# Patient Record
Sex: Male | Born: 1985 | Hispanic: Yes | Marital: Married | Smoking: Never smoker
Health system: Southern US, Community
[De-identification: ages and names within clinical notes are randomized; demographics above are authoritative.]

## PROBLEM LIST (undated history)

## (undated) DIAGNOSIS — E785 Hyperlipidemia, unspecified: Secondary | ICD-10-CM

## (undated) DIAGNOSIS — Z87442 Personal history of urinary calculi: Secondary | ICD-10-CM

## (undated) DIAGNOSIS — I10 Essential (primary) hypertension: Secondary | ICD-10-CM

## (undated) HISTORY — DX: Hyperlipidemia, unspecified: E78.5

## (undated) HISTORY — DX: Essential (primary) hypertension: I10

## (undated) HISTORY — PX: APPENDECTOMY: SHX54

---

## 2013-10-22 ENCOUNTER — Emergency Department: Payer: Self-pay | Admitting: Emergency Medicine

## 2014-11-18 IMAGING — CR RIGHT FOOT COMPLETE - 3+ VIEW
1 series · 3 of 3 positions shown · non-contrast
Comparison: None.

CLINICAL DATA: Pain at the right fourth and fifth metatarsals,
status post right foot injury.

EXAM:
RIGHT FOOT COMPLETE - 3+ VIEW

[Series 1: x foot ap right · 0.14mm/px · 3 of 3 slices shown]
[im 1/3]
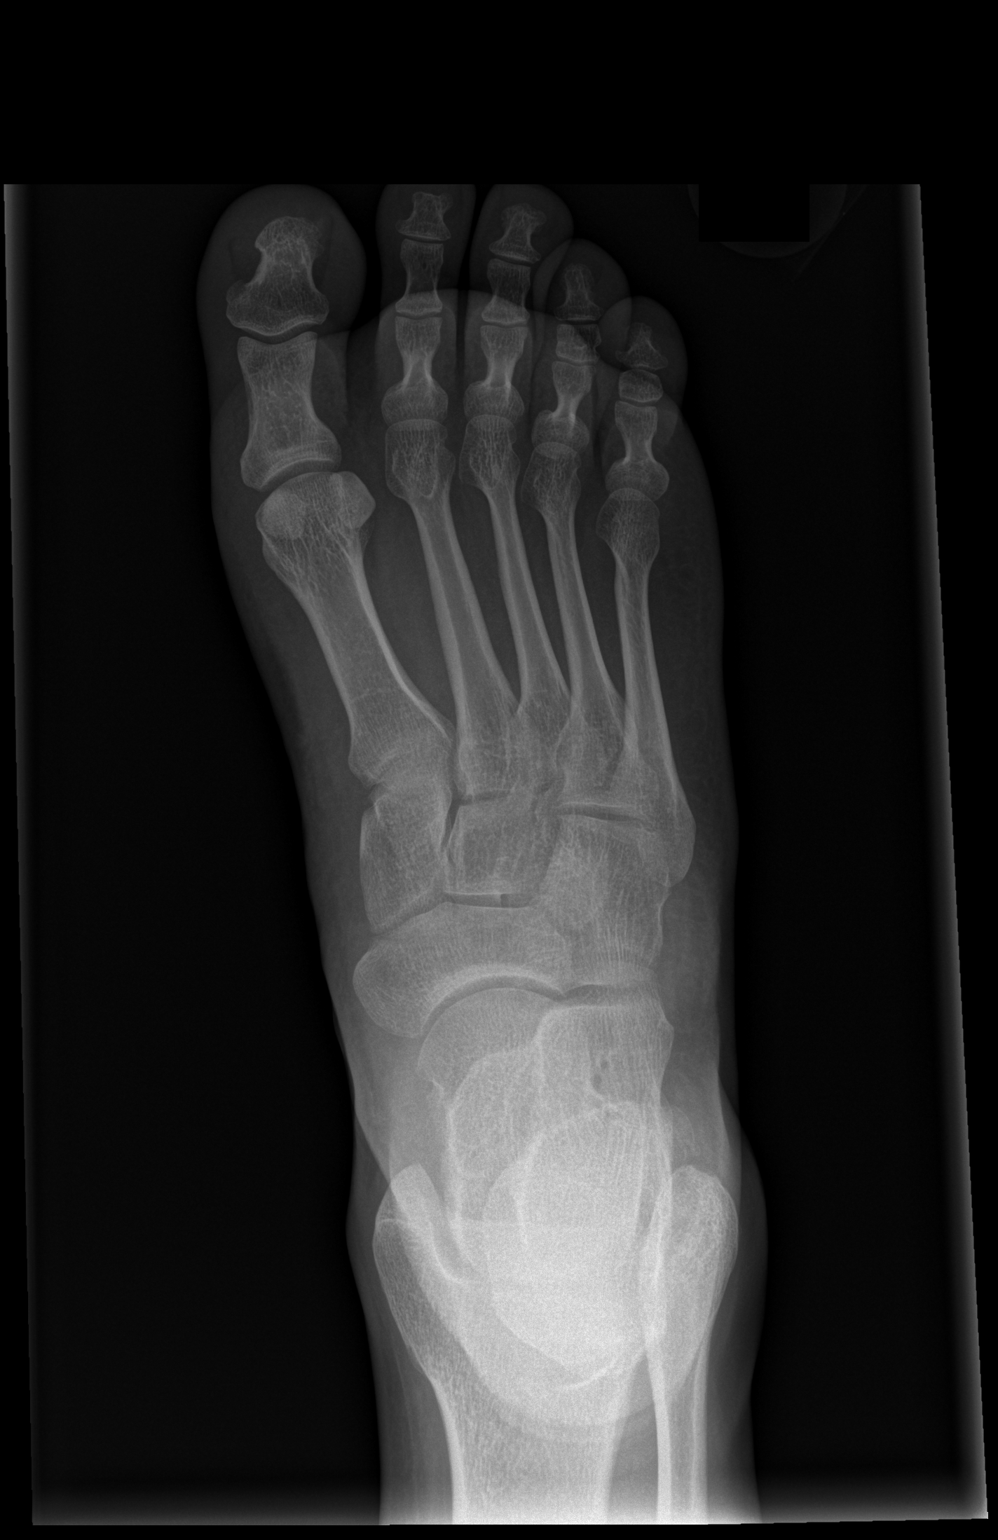
[im 2/3]
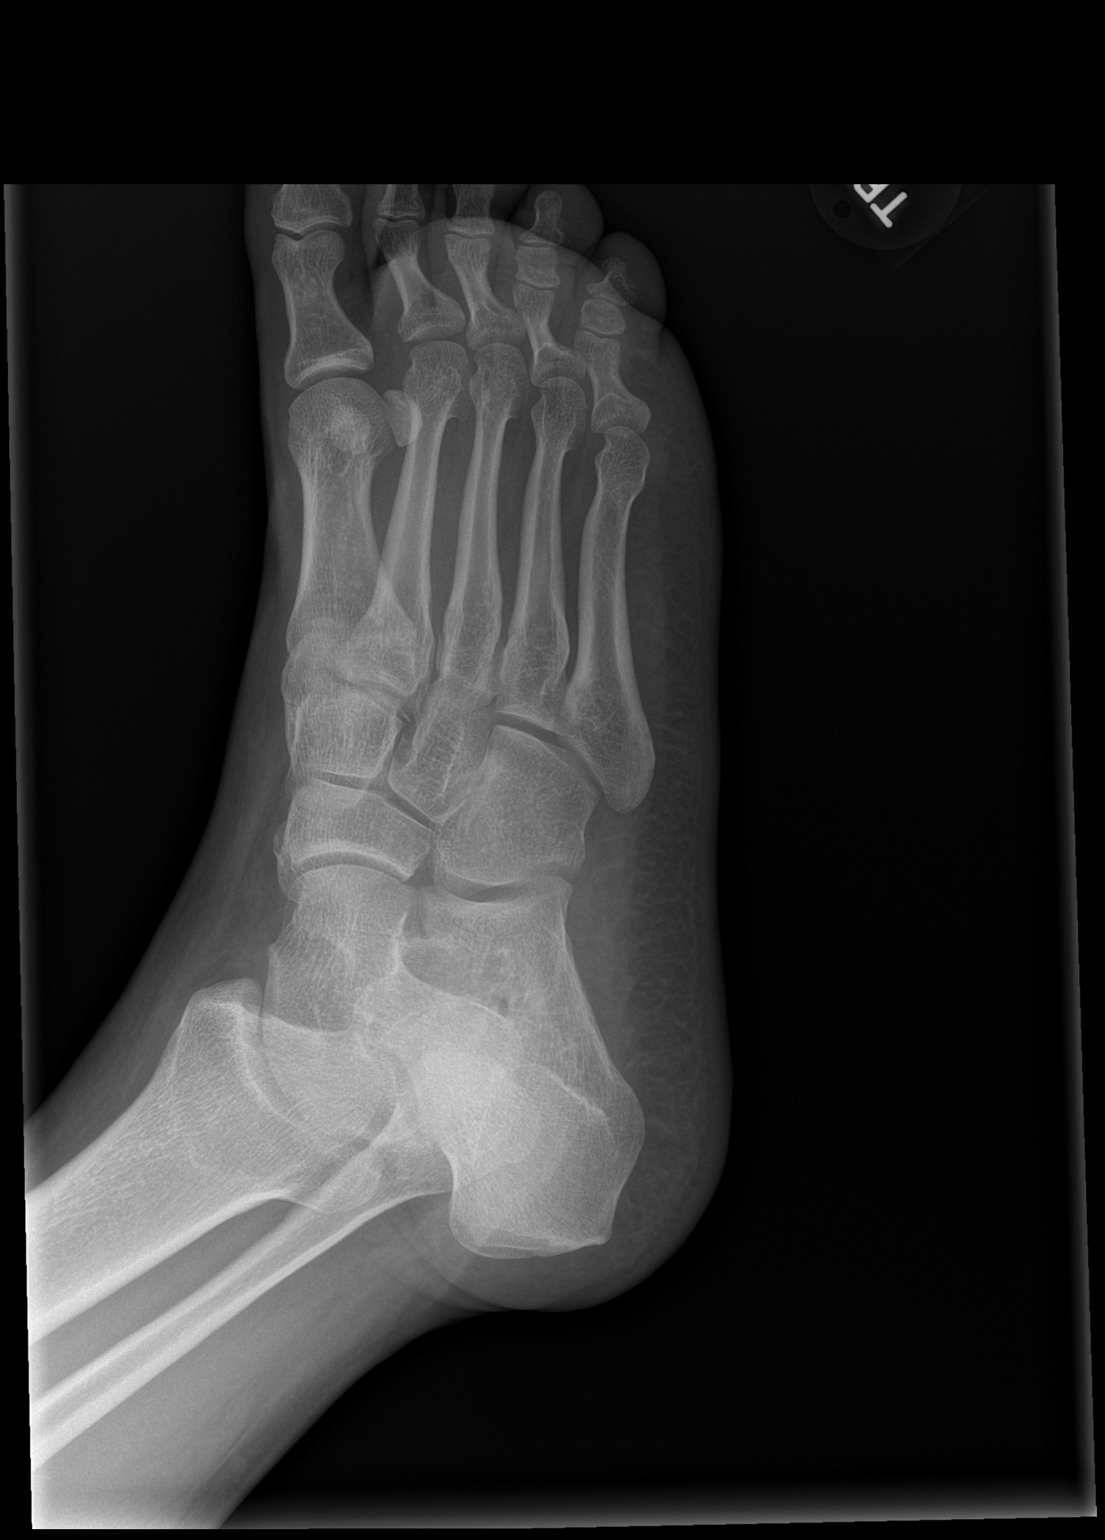
[im 3/3]
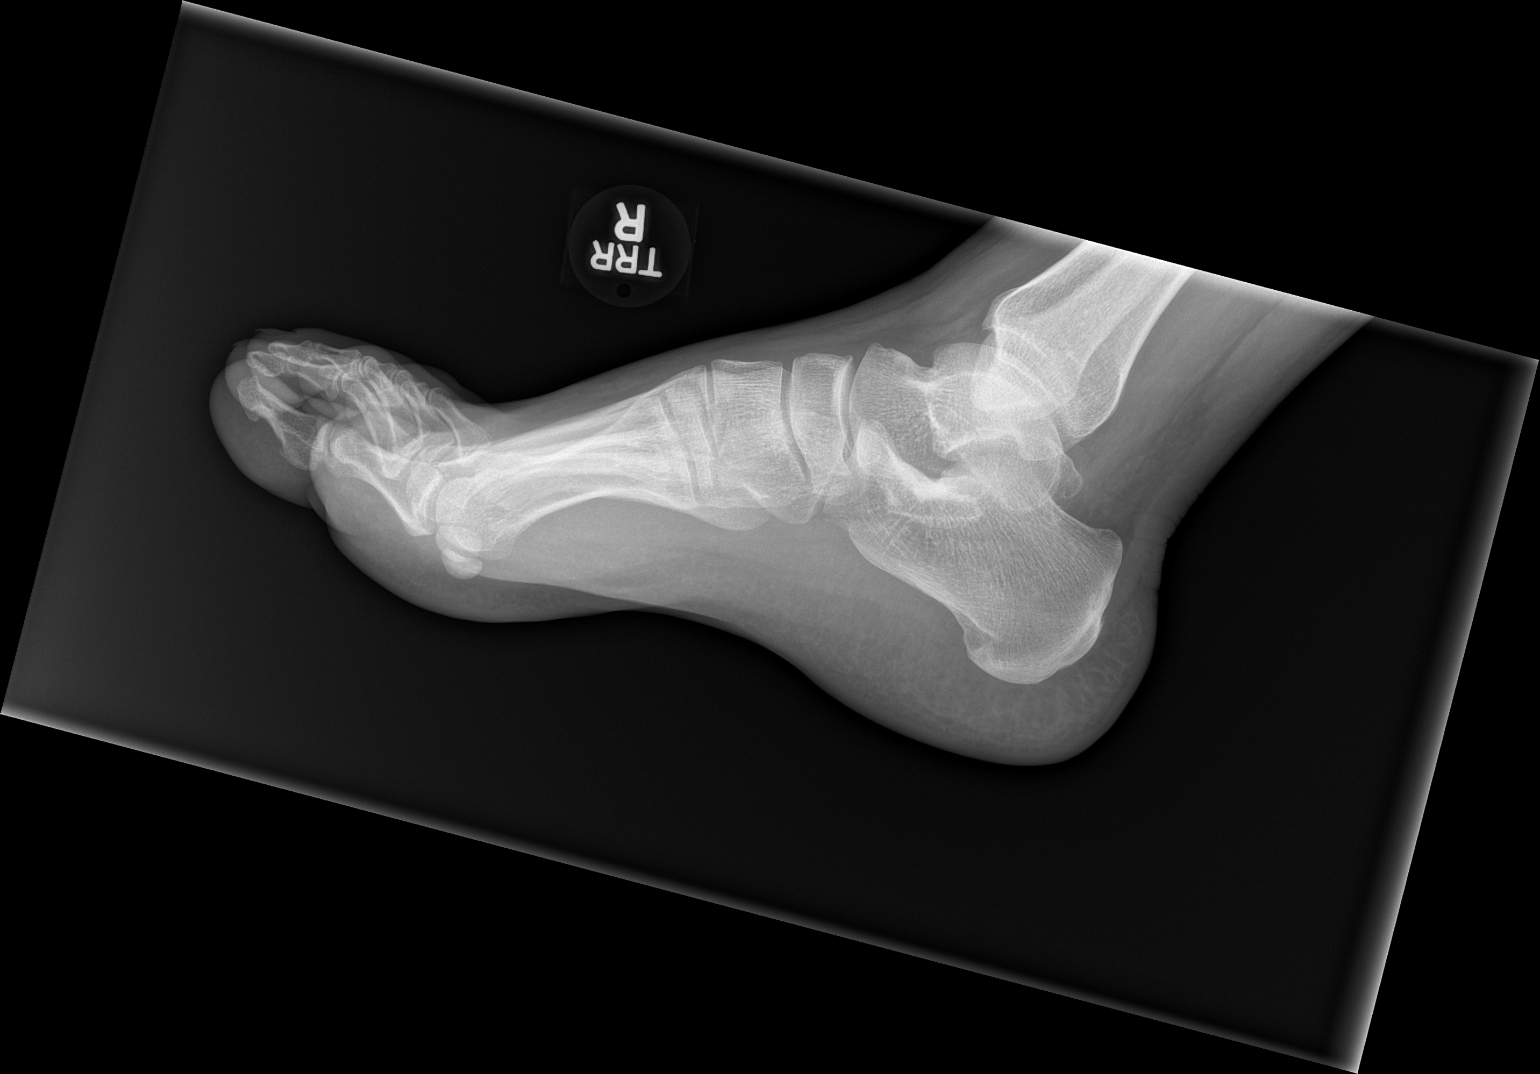

[3 of 3 positions shown; findings below may reference images not displayed]

FINDINGS: There is no evidence of fracture or dislocation. The joint spaces
are preserved. There is no evidence of talar subluxation; the
subtalar joint is unremarkable in appearance.

No significant soft tissue abnormalities are seen.
IMPRESSION: No evidence of fracture or dislocation.

## 2015-10-01 ENCOUNTER — Emergency Department
Admission: EM | Admit: 2015-10-01 | Discharge: 2015-10-01 | Disposition: A | Discharge status: Home / Self Care | Payer: Self-pay | Attending: Emergency Medicine | Admitting: Emergency Medicine

## 2015-10-01 ENCOUNTER — Encounter: Payer: Self-pay | Admitting: Emergency Medicine

## 2015-10-01 DIAGNOSIS — J40 Bronchitis, not specified as acute or chronic: Secondary | ICD-10-CM

## 2015-10-01 DIAGNOSIS — J209 Acute bronchitis, unspecified: Secondary | ICD-10-CM | POA: Insufficient documentation

## 2015-10-01 MED ORDER — IBUPROFEN 800 MG PO TABS
800.0000 mg | ORAL_TABLET | Freq: Once | ORAL | Status: AC
  Administered 2015-10-01: 800 mg via ORAL

## 2015-10-01 MED ORDER — PREDNISONE 10 MG PO TABS: 10.0000 mg | ORAL_TABLET | ORAL | Status: DC

## 2015-10-01 MED ORDER — ALBUTEROL SULFATE HFA 108 (90 BASE) MCG/ACT IN AERS: 2.0000 | INHALATION_SPRAY | RESPIRATORY_TRACT | Status: DC | PRN

## 2015-10-01 MED ORDER — IBUPROFEN 800 MG PO TABS
ORAL_TABLET | ORAL | Status: AC
  Filled 2015-10-01: qty 1

## 2015-10-01 MED ORDER — AZITHROMYCIN 250 MG PO TABS: ORAL_TABLET | ORAL | Status: DC

## 2015-10-01 MED ORDER — PSEUDOEPH-BROMPHEN-DM 30-2-10 MG/5ML PO SYRP: 10.0000 mL | ORAL_SOLUTION | Freq: Four times a day (QID) | ORAL | Status: DC | PRN

## 2015-10-01 NOTE — ED Notes (Signed)
States fever for about 3 days ago  occasional prod cough   Yellowish phlegm   Sore throat  Body aches

## 2015-10-01 NOTE — Discharge Instructions (Signed)

## 2015-10-01 NOTE — ED Notes (Signed)
Fever, cough for 3 days, also sore throat.

## 2015-10-01 NOTE — ED Provider Notes (Signed)
[facility] Emergency Department Provider Note  ____________________________________________  Time seen: Approximately 10:01 AM  I have reviewed the triage vital signs and the nursing notes.   HISTORY  Chief Complaint Fever and Cough    HPI Andrew Drake is a 30 y.o. male who presents emergency department complaining of sore throat, cough, fevers and chills, and body aches. Patient states that symptoms began insidiously with a sore throat. He then began to develop a productive cough with fevers and chills. He states after and chills began he began to have generalized body aches. Patient has been taking over-the-counter medication intermittently with minimal relief. He denies any headaches, visual acuity changes, nasal congestion, neck pain, chest pain, shortness of breath, nausea or vomiting, abdominal pain.   History reviewed. No pertinent past medical history.  There are no active problems to display for this patient.   Past Surgical History  Procedure Laterality Date  . Appendectomy      Current Outpatient Rx  Name  Route  Sig  Dispense  Refill  . albuterol (PROVENTIL HFA;VENTOLIN HFA) 108 (90 Base) MCG/ACT inhaler   Inhalation   Inhale 2 puffs into the lungs every 4 (four) hours as needed for wheezing or shortness of breath.   1 Inhaler   0   . azithromycin (ZITHROMAX Z-PAK) 250 MG tablet      Take 2 tablets (500 mg) on  Day 1,  followed by 1 tablet (250 mg) once daily on Days 2 through 5.   6 each   0   . brompheniramine-pseudoephedrine-DM 30-2-10 MG/5ML syrup   Oral   Take 10 mLs by mouth 4 (four) times daily as needed.   200 mL   0   . predniSONE (DELTASONE) 10 MG tablet   Oral   Take 1 tablet (10 mg total) by mouth as directed.   21 tablet   0     Take on a daily basis of 6, 5, 4, 3, 2, 1     Allergies Review of patient's allergies indicates no known allergies.  No family history on file.  Social History Social History   Substance Use Topics  . Smoking status: Never Smoker   . Smokeless tobacco: None  . Alcohol Use: No     Review of Systems  Constitutional: Positive for fever/chills Eyes: No visual changes. No discharge ENT: Positive for sore throat. He is nasal congestion. Cardiovascular: no chest pain. Respiratory: Positive for a productive cough. No SOB. Gastrointestinal: No abdominal pain.  No nausea, no vomiting.   Musculoskeletal: Negative for back pain. Negative for neck pain. Skin: Negative for rash. Neurological: Negative for headaches, focal weakness or numbness. 10-point ROS otherwise negative.  ____________________________________________   PHYSICAL EXAM:  VITAL SIGNS: ED Triage Vitals  Enc Vitals Group     BP 10/01/15 0923 134/70 mmHg     Pulse Rate 10/01/15 0923 101     Resp 10/01/15 0923 18     Temp 10/01/15 0923 101.8 F (38.8 C)     Temp Source 10/01/15 0923 Oral     SpO2 10/01/15 0923 98 %     Weight 10/01/15 0923 190 lb (86.183 kg)     Height 10/01/15 0923  (1.727 m)     Head Cir --      Peak Flow --      Pain Score 10/01/15 0923 8     Pain Loc --      Pain Edu? --      Excl.  in GC? --      Constitutional: Alert and oriented. Well appearing and in no acute distress. Eyes: Conjunctivae are normal. PERRL. EOMI. Head: Atraumatic. ENT:      Ears: ACs and TMs are unremarkable bilaterally.      Nose: No congestion/rhinnorhea.      Mouth/Throat: Mucous membranes are moist. Oropharynx is moderately erythematous. Tonsils are nonerythematous and nonedematous. Neck: No stridor.   Hematological/Lymphatic/Immunilogical: No cervical lymphadenopathy. Cardiovascular: Normal rate, regular rhythm. Normal S1 and S2.  Good peripheral circulation. Respiratory: Normal respiratory effort without tachypnea or retractions. Lungs with scattered wheezing. No rales or rhonchi. No absent or decreased breath sounds. Neurologic:  Normal speech and language. No gross focal neurologic  deficits are appreciated.  Skin:  Skin is warm, dry and intact. No rash noted. Psychiatric: Mood and affect are normal. Speech and behavior are normal. Patient exhibits appropriate insight and judgement.   ____________________________________________   LABS (all labs ordered are listed, but only abnormal results are displayed)  Labs Reviewed - No data to display ____________________________________________  EKG   ____________________________________________  RADIOLOGY   No results found.  ____________________________________________    PROCEDURES  Procedure(s) performed:       Medications - No data to display   ____________________________________________   INITIAL IMPRESSION / ASSESSMENT AND PLAN / ED COURSE  Pertinent labs & imaging results that were available during my care of the patient were reviewed by me and considered in my medical decision making (see chart for details).  Patient's diagnosis is consistent with bronchitis. Patient's symptoms began insidiously and her focus around cough and fevers and chills. No indication for flu at this time, patient is also outside of the window for treatment,so will not be tested for the emergency department. . Patient will be discharged home with prescriptions for cough syrup, albuterol, prednisone, and azithromycin. Patient is to follow up with primary care provider  if symptoms persist past this treatment course. Patient is given ED precautions to return to the ED for any worsening or new symptoms.     ____________________________________________  FINAL CLINICAL IMPRESSION(S) / ED DIAGNOSES  Final diagnoses:  Bronchitis      NEW MEDICATIONS STARTED DURING THIS VISIT:  New Prescriptions   ALBUTEROL (PROVENTIL HFA;VENTOLIN HFA) 108 (90 BASE) MCG/ACT INHALER    Inhale 2 puffs into the lungs every 4 (four) hours as needed for wheezing or shortness of breath.   AZITHROMYCIN (ZITHROMAX Z-PAK) 250 MG TABLET     Take 2 tablets (500 mg) on  Day 1,  followed by 1 tablet (250 mg) once daily on Days 2 through 5.   BROMPHENIRAMINE-PSEUDOEPHEDRINE-DM 30-2-10 MG/5ML SYRUP    Take 10 mLs by mouth 4 (four) times daily as needed.   PREDNISONE (DELTASONE) 10 MG TABLET    Take 1 tablet (10 mg total) by mouth as directed.        Delorise Royals Cuthriell, PA-C 10/01/15 1008  Jeanmarie Plant, MD 10/01/15 1051

## 2021-09-05 DIAGNOSIS — E782 Mixed hyperlipidemia: Secondary | ICD-10-CM | POA: Insufficient documentation

## 2023-03-05 ENCOUNTER — Emergency Department
Admission: EM | Admit: 2023-03-05 | Discharge: 2023-03-05 | Discharge status: Against Medical Advice | Payer: Self-pay | Attending: Emergency Medicine | Admitting: Emergency Medicine

## 2023-03-05 ENCOUNTER — Other Ambulatory Visit: Payer: Self-pay

## 2023-03-05 ENCOUNTER — Encounter: Payer: Self-pay | Admitting: *Deleted

## 2023-03-05 DIAGNOSIS — Z5321 Procedure and treatment not carried out due to patient leaving prior to being seen by health care provider: Secondary | ICD-10-CM | POA: Insufficient documentation

## 2023-03-05 DIAGNOSIS — R109 Unspecified abdominal pain: Secondary | ICD-10-CM | POA: Insufficient documentation

## 2023-03-05 LAB — COMPREHENSIVE METABOLIC PANEL
ALT: 22 U/L (ref 0–44)
AST: 19 U/L (ref 15–41)
Albumin: 4.3 g/dL (ref 3.5–5.0)
Alkaline Phosphatase: 65 U/L (ref 38–126)
Anion gap: 9 (ref 5–15)
BUN: 18 mg/dL (ref 6–20)
CO2: 26 mmol/L (ref 22–32)
Calcium: 9.2 mg/dL (ref 8.9–10.3)
Chloride: 104 mmol/L (ref 98–111)
Creatinine, Ser: 1.3 mg/dL — ABNORMAL HIGH (ref 0.61–1.24)
GFR, Estimated: >60 mL/min (ref >60)
Glucose, Bld: 103 mg/dL — ABNORMAL HIGH (ref 70–99)
Potassium: 3.8 mmol/L (ref 3.5–5.1)
Sodium: 139 mmol/L (ref 135–145)
Total Bilirubin: 0.6 mg/dL (ref 0.3–1.2)
Total Protein: 7.1 g/dL (ref 6.5–8.1)

## 2023-03-05 LAB — CBC
HCT: 40.5 % (ref 39.0–52.0)
Hemoglobin: 13.7 g/dL (ref 13.0–17.0)
MCH: 28.8 pg (ref 26.0–34.0)
MCHC: 33.8 g/dL (ref 30.0–36.0)
MCV: 85.1 fL (ref 80.0–100.0)
Platelets: 341 10*3/uL (ref 150–400)
RBC: 4.76 MIL/uL (ref 4.22–5.81)
RDW: 12.8 % (ref 11.5–15.5)
WBC: 9.3 10*3/uL (ref 4.0–10.5)
nRBC: 0 % (ref 0.0–0.2)

## 2023-03-05 LAB — LIPASE, BLOOD: Lipase: 53 U/L — ABNORMAL HIGH (ref 11–51)

## 2023-03-05 NOTE — ED Triage Notes (Signed)
Pt reports right side pain for 30 minutes.  No n/v/d.  Pt looks uncomfortable.  Pt took no meds for discomfort.  Pt alert.

## 2023-11-19 ENCOUNTER — Encounter: Payer: Self-pay | Admitting: General Practice

## 2023-11-19 ENCOUNTER — Ambulatory Visit: Payer: Self-pay | Admitting: General Practice

## 2023-11-19 VITALS — BP 142/102 | HR 79 | Temp 98.7°F | Ht 68.0 in | Wt 215.0 lb

## 2023-11-19 DIAGNOSIS — Z1159 Encounter for screening for other viral diseases: Secondary | ICD-10-CM

## 2023-11-19 DIAGNOSIS — E782 Mixed hyperlipidemia: Secondary | ICD-10-CM

## 2023-11-19 DIAGNOSIS — R030 Elevated blood-pressure reading, without diagnosis of hypertension: Secondary | ICD-10-CM | POA: Diagnosis not present

## 2023-11-19 DIAGNOSIS — I10 Essential (primary) hypertension: Secondary | ICD-10-CM | POA: Insufficient documentation

## 2023-11-19 DIAGNOSIS — Z23 Encounter for immunization: Secondary | ICD-10-CM

## 2023-11-19 DIAGNOSIS — Z114 Encounter for screening for human immunodeficiency virus [HIV]: Secondary | ICD-10-CM

## 2023-11-19 DIAGNOSIS — Z0000 Encounter for general adult medical examination without abnormal findings: Secondary | ICD-10-CM | POA: Insufficient documentation

## 2023-11-19 DIAGNOSIS — Z7689 Persons encountering health services in other specified circumstances: Secondary | ICD-10-CM | POA: Insufficient documentation

## 2023-11-19 LAB — LIPID PANEL
Cholesterol: 204 mg/dL — ABNORMAL HIGH (ref 0–200)
HDL: 40.4 mg/dL (ref >39.00)
LDL Cholesterol: 127 mg/dL — ABNORMAL HIGH (ref 0–99)
NonHDL: 163.53
Total CHOL/HDL Ratio: 5
Triglycerides: 181 mg/dL — ABNORMAL HIGH (ref 0.0–149.0)
VLDL: 36.2 mg/dL (ref 0.0–40.0)

## 2023-11-19 LAB — COMPREHENSIVE METABOLIC PANEL
ALT: 16 U/L (ref 0–53)
AST: 13 U/L (ref 0–37)
Albumin: 4.7 g/dL (ref 3.5–5.2)
Alkaline Phosphatase: 66 U/L (ref 39–117)
BUN: 16 mg/dL (ref 6–23)
CO2: 28 meq/L (ref 19–32)
Calcium: 9.9 mg/dL (ref 8.4–10.5)
Chloride: 103 meq/L (ref 96–112)
Creatinine, Ser: 1.03 mg/dL (ref 0.40–1.50)
GFR: 91.5 mL/min (ref >60.00)
Glucose, Bld: 97 mg/dL (ref 70–99)
Potassium: 4.7 meq/L (ref 3.5–5.1)
Sodium: 138 meq/L (ref 135–145)
Total Bilirubin: 0.5 mg/dL (ref 0.2–1.2)
Total Protein: 7.2 g/dL (ref 6.0–8.3)

## 2023-11-19 LAB — HEMOGLOBIN A1C: Hgb A1c MFr Bld: 5.7 % (ref 4.6–6.5)

## 2023-11-19 LAB — CBC
HCT: 43.7 % (ref 39.0–52.0)
Hemoglobin: 14.9 g/dL (ref 13.0–17.0)
MCHC: 34 g/dL (ref 30.0–36.0)
MCV: 86.3 fl (ref 78.0–100.0)
Platelets: 317 10*3/uL (ref 150.0–400.0)
RBC: 5.06 Mil/uL (ref 4.22–5.81)
RDW: 13.3 % (ref 11.5–15.5)
WBC: 6.4 10*3/uL (ref 4.0–10.5)

## 2023-11-19 LAB — TSH: TSH: 1.65 u[IU]/mL (ref 0.35–5.50)

## 2023-11-19 NOTE — Assessment & Plan Note (Signed)
 BP elevated on both readings today.   Discussed keeping a BP log at home and bring back in two weeks.   Exam stable.

## 2023-11-19 NOTE — Assessment & Plan Note (Signed)
 Reviewed lipid panel from Jan, 2024.   Continue Zetia.  Repeat Lipid panel pending.

## 2023-11-19 NOTE — Assessment & Plan Note (Signed)
 Immunizations UTD. Influenza vaccine given today.  Discussed the importance of a healthy diet and regular exercise in order for weight loss, and to reduce the risk of further co-morbidity.  Exam stable. Labs pending.  Follow up in 1 year for repeat physical.

## 2023-11-19 NOTE — Assessment & Plan Note (Signed)
 EMR reviewed briefly.

## 2023-11-19 NOTE — Patient Instructions (Signed)
 Stop by the lab prior to leaving today. I will notify you of your results once received.   Keep BP log at home and bring it back in two weeks.   It was a pleasure meeting you!

## 2023-11-19 NOTE — Progress Notes (Signed)
 New Patient Office Visit  Subjective    Patient ID: Andrew Drake, male    DOB: Jan 26, 1986  Age: 38 y.o. MRN: [id number]  CC:  Chief Complaint  Patient presents with   New Patient (Initial Visit)   Annual Exam    HPI Charlie Seda is a 38 y.o. male presents to establish care, complete physical and follow up of chronic conditions.  Immunizations: -Tetanus: Completed in 2018 -Influenza: due  Diet: Fair diet.  Exercise: Regular exercise. 4-5 days a week for 90 mins. Running, lifting weights. Physically active at work.   Eye exam: Completes annually  Dental exam: Completes semi-annually    HLD: has been on zetia 10 mg once daily. He has been tolerating it well. No concerns today.    Outpatient Encounter Medications as of 11/19/2023  Medication Sig   ezetimibe (ZETIA) 10 MG tablet Take by mouth.   [DISCONTINUED] albuterol (PROVENTIL HFA;VENTOLIN HFA) 108 (90 Base) MCG/ACT inhaler Inhale 2 puffs into the lungs every 4 (four) hours as needed for wheezing or shortness of breath.   [DISCONTINUED] Ascorbic Acid 125 MG CHEW Chew by mouth.   [DISCONTINUED] azithromycin (ZITHROMAX Z-PAK) 250 MG tablet Take 2 tablets (500 mg) on  Day 1,  followed by 1 tablet (250 mg) once daily on Days 2 through 5.   [DISCONTINUED] brompheniramine-pseudoephedrine-DM 30-2-10 MG/5ML syrup Take 10 mLs by mouth 4 (four) times daily as needed.   [DISCONTINUED] predniSONE (DELTASONE) 10 MG tablet Take 1 tablet (10 mg total) by mouth as directed.   No facility-administered encounter medications on file as of 11/19/2023.    History reviewed. No pertinent past medical history.  Past Surgical History:  Procedure Laterality Date   APPENDECTOMY      Family History  Problem Relation Age of Onset   Diabetes Mother    Diabetes Brother     Social History   Socioeconomic History   Marital status: Married    Spouse name: Karena Addison   Number of children: Not on file   Years of education: Not on file   Highest  education level: Not on file  Occupational History   Not on file  Tobacco Use   Smoking status: Never   Smokeless tobacco: Not on file  Vaping Use   Vaping status: Never Used  Substance and Sexual Activity   Alcohol use: No   Drug use: Never   Sexual activity: Yes  Other Topics Concern   Not on file  Social History Narrative   Not on file   Social Drivers of Health   Financial Resource Strain: Medium Risk (09/05/2021)   Received from [facility], [facility]   Overall Financial Resource Strain (CARDIA)    Difficulty of Paying Living Expenses: Somewhat hard  Food Insecurity: No Food Insecurity (09/05/2021)   Received from [facility], [facility]   Hunger Vital Sign    Worried About Running Out of Food in the Last Year: Never true    Ran Out of Food in the Last Year: Never true  Transportation Needs: No Transportation Needs (09/05/2021)   Received from [facility], [facility]   PRAPARE - Transportation    Lack of Transportation (Medical): No    Lack of Transportation (Non-Medical): No  Physical Activity: Not on file  Stress: Not on file  Social Connections: Not on file  Intimate Partner Violence: Not At Risk (09/05/2022)   Received from [facility], [facility]   Humiliation, Afraid, Rape,  and Kick questionnaire    Fear of Current or Ex-Partner: No    Emotionally Abused: No    Physically Abused: No    Sexually Abused: No    Review of Systems  Constitutional:  Negative for chills, fever, malaise/fatigue and weight loss.  HENT:  Negative for congestion, ear discharge, ear pain, hearing loss, nosebleeds, sinus pain, sore throat and tinnitus.   Eyes:  Negative for blurred vision, double vision, pain, discharge and redness.  Respiratory:  Negative for cough, shortness of breath, wheezing and stridor.   Cardiovascular:  Negative for chest pain, palpitations and leg swelling.  Gastrointestinal:  Negative for abdominal pain, constipation, diarrhea,  heartburn, nausea and vomiting.  Genitourinary:  Negative for dysuria, frequency and urgency.  Musculoskeletal:  Negative for myalgias.  Skin:  Negative for rash.  Neurological:  Negative for dizziness, tingling, seizures, weakness and headaches.  Psychiatric/Behavioral:  Negative for depression, substance abuse and suicidal ideas. The patient is not nervous/anxious.         Objective    BP (!) 142/102 (BP Location: Left Arm, Patient Position: Sitting, Cuff Size: Normal)   Pulse 79   Temp 98.7 F (37.1 C) (Oral)   Ht 5\' 8"  (1.727 m)   Wt 215 lb (97.5 kg)   SpO2 98%   BMI 32.69 kg/m   Physical Exam Vitals and nursing note reviewed.  Constitutional:      Appearance: Normal appearance.  HENT:     Head: Normocephalic and atraumatic.     Right Ear: Tympanic membrane, ear canal and external ear normal.     Left Ear: Tympanic membrane, ear canal and external ear normal.     Nose: Nose normal.     Mouth/Throat:     Mouth: Mucous membranes are moist.     Pharynx: Oropharynx is clear.  Eyes:     Conjunctiva/sclera: Conjunctivae normal.     Pupils: Pupils are equal, round, and reactive to light.  Cardiovascular:     Rate and Rhythm: Normal rate and regular rhythm.     Pulses: Normal pulses.     Heart sounds: Normal heart sounds.  Pulmonary:     Effort: Pulmonary effort is normal.     Breath sounds: Normal breath sounds.  Abdominal:     General: Abdomen is flat. Bowel sounds are normal.     Palpations: Abdomen is soft.  Musculoskeletal:        General: Normal range of motion.     Cervical back: Normal range of motion.  Skin:    General: Skin is warm and dry.     Capillary Refill: Capillary refill takes less than 2 seconds.  Neurological:     General: No focal deficit present.     Mental Status: He is alert and oriented to person, place, and time. Mental status is at baseline.  Psychiatric:        Mood and Affect: Mood normal.        Behavior: Behavior normal.         Thought Content: Thought content normal.        Judgment: Judgment normal.         Assessment & Plan:  Encounter for screening and preventative care Assessment & Plan: Immunizations UTD. Influenza vaccine given today.  Discussed the importance of a healthy diet and regular exercise in order for weight loss, and to reduce the risk of further co-morbidity.  Exam stable. Labs pending.  Follow up in 1 year for repeat physical.  Orders: -     CBC -     Comprehensive metabolic panel -     TSH -     Lipid panel  Establishing care with new doctor, encounter for Assessment & Plan: EMR reviewed  briefly.   Screening for HIV (human immunodeficiency virus) -     HIV Antibody (routine testing w rflx)  Encounter for hepatitis C screening test for low risk patient -     Hepatitis C antibody  Mixed hyperlipidemia Assessment & Plan: Reviewed lipid panel from Jan, 2024.   Continue Zetia.  Repeat Lipid panel pending.  Orders: -     Lipid panel -     Hemoglobin A1c  Encounter for immunization -     Flu vaccine trivalent PF, 6mos and older(Flulaval,Afluria,Fluarix,Fluzone)  Elevated blood pressure reading Assessment & Plan: BP elevated on both readings today.   Discussed keeping a BP log at home and bring back in two weeks.   Exam stable.    Return in about 2 weeks (around 12/03/2023) for BP.   [place] Charon, NP

## 2023-11-20 LAB — HIV ANTIBODY (ROUTINE TESTING W REFLEX): HIV 1&2 Ab, 4th Generation: NONREACTIVE

## 2023-11-20 LAB — HEPATITIS C ANTIBODY: Hepatitis C Ab: NONREACTIVE

## 2023-12-19 ENCOUNTER — Encounter: Payer: Self-pay | Admitting: General Practice

## 2023-12-19 ENCOUNTER — Ambulatory Visit: Admitting: General Practice

## 2023-12-19 VITALS — BP 120/72 | HR 79 | Temp 98.7°F | Ht 68.0 in | Wt 210.0 lb

## 2023-12-19 DIAGNOSIS — R030 Elevated blood-pressure reading, without diagnosis of hypertension: Secondary | ICD-10-CM

## 2023-12-19 DIAGNOSIS — E782 Mixed hyperlipidemia: Secondary | ICD-10-CM

## 2023-12-19 MED ORDER — GEMFIBROZIL 600 MG PO TABS: 600.0000 mg | ORAL_TABLET | Freq: Once | ORAL | Status: DC

## 2023-12-19 NOTE — Assessment & Plan Note (Signed)
 Discussed the importance of eating a low fat, low cholesterol diet and increasing exercise.  Will repeat lipid panel in 6 months.  Handout provided.   The ASCVD Risk score (Arnett DK, et al., 2019) failed to calculate for the following reasons:   The 2019 ASCVD risk score is only valid for ages 81 to 76

## 2023-12-19 NOTE — Patient Instructions (Addendum)
 The cholesterol is elevated. He should follow a low fat, low cholesterol diet. Repeat test in 6-12 months.  Start monitoring your blood pressure daily, around the same time of day, for the next 2-3 weeks.  Ensure that you have rested for 30 minutes prior to checking your blood pressure.   Record your readings and notify me if you see numbers consistently at or above 130 on top and/or 90 on bottom.  Increase your exercise and eat a low fat low cholesterol diet.   Will repeat lipid panel in 6 months.   Follow up in 2 weeks for BP.   It was a pleasure to see you today!

## 2023-12-19 NOTE — Assessment & Plan Note (Signed)
 Both BP readings in the office were within normal range.   Home readings continue to be elevated.  Home cuff was checked. Suspect this could be related to the cuff.  He will get a new cuff and bring back a log in two weeks.  Exam stable today.  Discussed DASH diet.  Follow up in 2 weeks.

## 2023-12-19 NOTE — Progress Notes (Signed)
 Established Patient Office Visit  Subjective   Patient ID: Andrew Drake, male    DOB: 1985-11-10  Age: 38 y.o. MRN: [id number]  Chief Complaint  Patient presents with   Hypertension    Hypertension Pertinent negatives include no chest pain, headaches or shortness of breath.    Andrew Drake is a 38 year old male with past medical history of HLD, elevated BP reading presents today for a follow up in hypertension.   HTN: he was evaluated on 11/19/23 when he was found to have elevated BP readings in the office. He was given a BP log and was asked to bring it back in two weeks. CBC, CMP completed on 11/20/23 were within normal limits. Today he reports that the home BP readings have been in the 130's-140's / 70-80's range. He has never been on medication at home. He has a digital cuff at home. He denies any blurred vision, headache, chest pain or shortness of breath.   Mixed HLD: currently managed on zetia 10 mg once daily. Lipid panel on 11/20/23 borderline elevated. He reports today he has been also take Gemfibrozil  600 mg once daily. He denies any arthralgias, chest pain, shortness of breath or difficulty breathing. His wife     Patient Active Problem List   Diagnosis Date Noted   Encounter for screening and preventative care 11/19/2023   Elevated blood pressure reading 11/19/2023   Mixed hyperlipidemia 09/05/2021   History reviewed. No pertinent past medical history. Past Surgical History:  Procedure Laterality Date   APPENDECTOMY     Allergies  Allergen Reactions   Atorvastatin     impotency         12/19/2023    8:22 AM 11/19/2023    9:55 AM  Depression screen PHQ 2/9  Decreased Interest 0 0  Down, Depressed, Hopeless 0 0  PHQ - 2 Score 0 0  Altered sleeping 0 0  Tired, decreased energy 0 0  Change in appetite 0 0  Feeling bad or failure about yourself  0 0  Trouble concentrating 0 0  Moving slowly or fidgety/restless 0 0  Suicidal thoughts 0 0  PHQ-9 Score 0 0   Difficult doing work/chores Not difficult at all Not difficult at all       12/19/2023    8:22 AM 11/19/2023    9:55 AM  GAD 7 : Generalized Anxiety Score  Nervous, Anxious, on Edge 0 0  Control/stop worrying 0 0  Worry too much - different things 0 0  Trouble relaxing 0 0  Restless 0 0  Easily annoyed or irritable 0 0  Afraid - awful might happen 0 0  Total GAD 7 Score 0 0  Anxiety Difficulty Not difficult at all Not difficult at all      Review of Systems  Constitutional:  Negative for chills and fever.  Respiratory:  Negative for shortness of breath.   Cardiovascular:  Negative for chest pain and leg swelling.  Gastrointestinal:  Negative for abdominal pain, constipation, diarrhea, heartburn, nausea and vomiting.  Genitourinary:  Negative for dysuria, frequency and urgency.  Neurological:  Negative for dizziness and headaches.  Endo/Heme/Allergies:  Negative for polydipsia.  Psychiatric/Behavioral:  Negative for depression and suicidal ideas. The patient is not nervous/anxious.       Objective:     BP 120/72 (BP Location: Left Arm, Patient Position: Sitting, Cuff Size: Normal)   Pulse 79   Temp 98.7 F (37.1 C) (Oral)   Ht 5\' 8"  (1.727 m)  Wt 210 lb (95.3 kg)   SpO2 98%   BMI 31.93 kg/m  BP Readings from Last 3 Encounters:  12/19/23 120/72  11/19/23 (!) 142/102  10/01/15 134/70   Wt Readings from Last 3 Encounters:  12/19/23 210 lb (95.3 kg)  11/19/23 215 lb (97.5 kg)  10/01/15 190 lb (86.2 kg)      Physical Exam Vitals and nursing note reviewed.  Constitutional:      Appearance: Normal appearance.  Cardiovascular:     Rate and Rhythm: Normal rate and regular rhythm.     Pulses: Normal pulses.     Heart sounds: Normal heart sounds.  Pulmonary:     Effort: Pulmonary effort is normal.     Breath sounds: Normal breath sounds.  Musculoskeletal:     Right lower leg: No edema.     Left lower leg: No edema.  Neurological:     Mental Status: He is  alert and oriented to person, place, and time.  Psychiatric:        Mood and Affect: Mood normal.        Behavior: Behavior normal.        Thought Content: Thought content normal.        Judgment: Judgment normal.      No results found for any visits on 12/19/23.     The ASCVD Risk score (Arnett DK, et al., 2019) failed to calculate for the following reasons:   The 2019 ASCVD risk score is only valid for ages 56 to 76    Assessment & Plan:  Elevated blood pressure reading Assessment & Plan: Both BP readings in the office were within normal range.   Home readings continue to be elevated.  Home cuff was checked. Suspect this could be related to the cuff.  He will get a new cuff and bring back a log in two weeks.  Exam stable today.  Discussed DASH diet.  Follow up in 2 weeks.   Mixed hyperlipidemia Assessment & Plan: Discussed the importance of eating a low fat, low cholesterol diet and increasing exercise.  Will repeat lipid panel in 6 months.  Handout provided.   The ASCVD Risk score (Arnett DK, et al., 2019) failed to calculate for the following reasons:   The 2019 ASCVD risk score is only valid for ages 26 to 69   Orders: -     Gemfibrozil ; Take 1 tablet (600 mg total) by mouth once for 1 dose.     Return in about 2 weeks (around 01/02/2024) for BP.    Jolanda Nation, NP

## 2024-01-03 ENCOUNTER — Ambulatory Visit: Admitting: General Practice

## 2024-01-03 ENCOUNTER — Encounter: Payer: Self-pay | Admitting: General Practice

## 2024-01-03 VITALS — BP 128/80 | HR 75 | Temp 98.2°F | Ht 68.0 in | Wt 208.0 lb

## 2024-01-03 DIAGNOSIS — I10 Essential (primary) hypertension: Secondary | ICD-10-CM | POA: Diagnosis not present

## 2024-01-03 NOTE — Assessment & Plan Note (Addendum)
 Controlled.  Home readings improved with lifestyle modifications.  Commended patient on his progress.   Follow up in September.

## 2024-01-03 NOTE — Progress Notes (Signed)
 Established Patient Office Visit  Subjective   Patient ID: Andrew Drake, male    DOB: 27-Feb-1986  Age: 38 y.o. MRN: [id number]  Chief Complaint  Patient presents with   Hypertension    Patient here today to f/u on BP; BP log looks very good.     Hypertension Pertinent negatives include no chest pain, headaches or shortness of breath.    Andrew Drake is a 38 year old male with past medical history of mixed HLD, hypertension presents today for a follow up on HTN.   HTN: He has had multiple elevated home BP readings. he reports that he was drinking Monster drink once daily. He stopped drinking those daily. He reports working longer hours and goes on long drives. He gets tired mid day and that's when he would drink the energy drink. He has been running 3 miles daily. He has been monitoring his diet by using no salt, cutting out all sugars and increased his water intake. The home BP readings have been in the 110's-120s / 70s-80's range. He denies any blurred vision, headache, chest pain, or shortness of breath.   Patient Active Problem List   Diagnosis Date Noted   Encounter for screening and preventative care 11/19/2023   Hypertension 11/19/2023   Mixed hyperlipidemia 09/05/2021   History reviewed. No pertinent past medical history. Past Surgical History:  Procedure Laterality Date   APPENDECTOMY     Allergies  Allergen Reactions   Atorvastatin     impotency         01/03/2024    8:15 AM 12/19/2023    8:22 AM 11/19/2023    9:55 AM  Depression screen PHQ 2/9  Decreased Interest 0 0 0  Down, Depressed, Hopeless 0 0 0  PHQ - 2 Score 0 0 0  Altered sleeping 0 0 0  Tired, decreased energy 0 0 0  Change in appetite 0 0 0  Feeling bad or failure about yourself  0 0 0  Trouble concentrating 0 0 0  Moving slowly or fidgety/restless 0 0 0  Suicidal thoughts 0 0 0  PHQ-9 Score 0 0 0  Difficult doing work/chores Not difficult at all Not difficult at all Not difficult at all        01/03/2024    8:15 AM 12/19/2023    8:22 AM 11/19/2023    9:55 AM  GAD 7 : Generalized Anxiety Score  Nervous, Anxious, on Edge 0 0 0  Control/stop worrying 0 0 0  Worry too much - different things 0 0 0  Trouble relaxing 0 0 0  Restless 0 0 0  Easily annoyed or irritable 0 0 0  Afraid - awful might happen 0 0 0  Total GAD 7 Score 0 0 0  Anxiety Difficulty Not difficult at all Not difficult at all Not difficult at all      Review of Systems  Constitutional:  Negative for chills and fever.  Respiratory:  Negative for shortness of breath.   Cardiovascular:  Negative for chest pain.  Gastrointestinal:  Negative for abdominal pain, constipation, diarrhea, heartburn, nausea and vomiting.  Genitourinary:  Negative for dysuria, frequency and urgency.  Neurological:  Negative for dizziness and headaches.  Endo/Heme/Allergies:  Negative for polydipsia.  Psychiatric/Behavioral:  Negative for depression and suicidal ideas. The patient is not nervous/anxious.       Objective:     BP 128/80 (BP Location: Left Arm, Patient Position: Sitting, Cuff Size: Normal)   Pulse 75  Temp 98.2 F (36.8 C) (Oral)   Ht 5\' 8"  (1.727 m)   Wt 208 lb (94.3 kg)   SpO2 95%   BMI 31.63 kg/m  BP Readings from Last 3 Encounters:  01/03/24 128/80  12/19/23 120/72  11/19/23 (!) 142/102   Wt Readings from Last 3 Encounters:  01/03/24 208 lb (94.3 kg)  12/19/23 210 lb (95.3 kg)  11/19/23 215 lb (97.5 kg)      Physical Exam Vitals and nursing note reviewed.  Constitutional:      Appearance: Normal appearance.  Cardiovascular:     Rate and Rhythm: Normal rate and regular rhythm.     Pulses: Normal pulses.     Heart sounds: Normal heart sounds.  Pulmonary:     Effort: Pulmonary effort is normal.     Breath sounds: Normal breath sounds.  Neurological:     Mental Status: He is alert and oriented to person, place, and time.  Psychiatric:        Mood and Affect: Mood normal.        Behavior:  Behavior normal.        Thought Content: Thought content normal.        Judgment: Judgment normal.      No results found for any visits on 01/03/24.     The ASCVD Risk score (Arnett DK, et al., 2019) failed to calculate for the following reasons:   The 2019 ASCVD risk score is only valid for ages 3 to 54    Assessment & Plan:  Primary hypertension Assessment & Plan: Controlled.  Home readings improved with lifestyle modifications.  Commended patient on his progress.   Follow up in September.      Return in about 20 weeks (around 05/22/2024) for cholesterol and mole.    Jolanda Nation, NP

## 2024-01-03 NOTE — Patient Instructions (Signed)
 Great job on your blood pressures and all the exercise.  Keep up the good work!!!!  Follow up at the end of September.   It was a pleasure to see you today!

## 2024-03-05 ENCOUNTER — Ambulatory Visit: Payer: Self-pay

## 2024-03-05 NOTE — Telephone Encounter (Signed)
 Noted I will assess him at tomorrow's visit

## 2024-03-05 NOTE — Telephone Encounter (Signed)
 FYI Only or Action Required?: FYI only for provider.  Patient was last seen in primary care on 01/03/2024 by Vincente Shivers, NP.  Called Nurse Triage reporting Abdominal Pain.  Symptoms began today.  Interventions attempted: OTC medications: Tylenol and Rest, hydration, or home remedies.  Symptoms are: unchanged.  Triage Disposition: See Physician Within 24 Hours  Patient/caregiver understands and will follow disposition?: Yes  Copied from CRM [id number]. Topic: Clinical - Red Word Triage >> Mar 05, 2024 10:04 AM Henretta I wrote: Red Word that prompted transfer to Nurse Triage: Patient's wife called to notify that patient  is having severe sharp pain on side.  Location:Where does it hurt? Left side of abdomen Radiation: Does the pain shoot anywhere else? No radiation Onset-when did the pain begin? Started this morning Sudden: Sudden or gradual onset?  Sudden Pattern-Does the pain come and go, or is it constant? Constant Severity of Pain-How bad is the pain? Pain is 4-5 out of 10. Recurrent Symptom-Have you ever had this type of stomach pain before?-yes occurred about a month ago. Cause: What do you think is causing the stomach pain? Concerned by possible kidney stones Relieving/Aggravating Factors? What makes it better or worse? Tylenol makes the pain better. Other symptoms: Any other symptoms? No other symptoms.   Patient reports taking home remedy thinking the pain is kidney stones. Patient endorses the pain started this morning rating it 4-5 out of 10. Scheduled for acute appointment with another provider in PCP office tomorrow at 4:00 PM  Reason for Disposition  [1] MODERATE pain (e.g., interferes with normal activities) AND [2] pain comes and goes (cramps) AND [3] present > 24 hours  (Exception: Pain with Vomiting or Diarrhea - see that Guideline.)  Protocols used: Abdominal Pain - Male-A-AH

## 2024-03-06 ENCOUNTER — Ambulatory Visit: Admitting: Internal Medicine

## 2024-03-11 ENCOUNTER — Telehealth: Payer: Self-pay

## 2024-03-11 NOTE — Transitions of Care (Post Inpatient/ED Visit) (Signed)
 Unable to reach pt by phone and sent my chart note to pt to call [phone]. Unable to send chart note due to pt not signed uo for my chart.      03/11/2024  Name: Andrew Drake MRN: [id number] DOB: 04-29-1986  Today's TOC FU Call Status: Today's TOC FU Call Status:: Unsuccessful Call (1st Attempt) Unsuccessful Call (1st Attempt) Date: 03/11/24  Attempted to reach the patient regarding the most recent Inpatient/ED visit.  Follow Up Plan: Additional outreach attempts will be made to reach the patient to complete the Transitions of Care (Post Inpatient/ED visit) call.   Signature  Laray Arenas, LPN

## 2024-03-12 NOTE — Transitions of Care (Post Inpatient/ED Visit) (Signed)
 Unable to reach pt on phone and v/m not setup yet.     03/12/2024  Name: Andrew Drake MRN: [id number] DOB: Jan 22, 1986  Today's TOC FU Call Status: Today's TOC FU Call Status:: Unsuccessful Call (2nd Attempt) Unsuccessful Call (1st Attempt) Date: 03/11/24 Unsuccessful Call (2nd Attempt) Date: 03/12/24  Attempted to reach the patient regarding the most recent Inpatient/ED visit.  Follow Up Plan: Additional outreach attempts will be made to reach the patient to complete the Transitions of Care (Post Inpatient/ED visit) call.   Signature Laray Arenas, LPN

## 2024-03-13 NOTE — Transitions of Care (Post Inpatient/ED Visit) (Signed)
 Unable to reach patient by phone and left v/m requesting call back at [phone]. This the third unsuccessful attempt to reach pt by phone.unable to send my chart note since pt is not signed up for my chart. Sending note to ONEIDA Patrick FNP since KATHEE Aurora NP is out of office.     03/13/2024  Name: Andrew Drake MRN: [id number] DOB: 12/28/1985  Today's TOC FU Call Status: Today's TOC FU Call Status:: Unsuccessful Call (3rd Attempt) Unsuccessful Call (1st Attempt) Date: 03/11/24 Unsuccessful Call (2nd Attempt) Date: 03/12/24 Unsuccessful Call (3rd Attempt) Date: 03/13/24  Attempted to reach the patient regarding the most recent Inpatient/ED visit.  Follow Up Plan: No further outreach attempts will be made at this time. We have been unable to contact the patient.  Signature Laray Arenas, LPN

## 2024-03-14 NOTE — Telephone Encounter (Signed)
 NOTED

## 2024-03-28 ENCOUNTER — Ambulatory Visit: Admitting: Urology

## 2024-04-15 ENCOUNTER — Ambulatory Visit (INDEPENDENT_AMBULATORY_CARE_PROVIDER_SITE_OTHER): Admitting: Urology

## 2024-04-15 VITALS — BP 115/68 | HR 83 | Ht 68.0 in | Wt 200.0 lb

## 2024-04-15 DIAGNOSIS — N201 Calculus of ureter: Secondary | ICD-10-CM | POA: Diagnosis not present

## 2024-04-15 DIAGNOSIS — N132 Hydronephrosis with renal and ureteral calculous obstruction: Secondary | ICD-10-CM

## 2024-04-15 DIAGNOSIS — N200 Calculus of kidney: Secondary | ICD-10-CM

## 2024-04-15 LAB — URINALYSIS, COMPLETE
Bilirubin, UA: NEGATIVE
Glucose, UA: NEGATIVE
Ketones, UA: NEGATIVE
Leukocytes,UA: NEGATIVE
Nitrite, UA: NEGATIVE
Protein,UA: NEGATIVE
RBC, UA: NEGATIVE
Specific Gravity, UA: 1.01 (ref 1.005–1.030)
Urobilinogen, Ur: 0.2 mg/dL (ref 0.2–1.0)
pH, UA: 6 (ref 5.0–7.5)

## 2024-04-15 LAB — MICROSCOPIC EXAMINATION
Bacteria, UA: NONE SEEN
WBC, UA: NONE SEEN /HPF (ref 0–5)

## 2024-04-20 ENCOUNTER — Encounter: Payer: Self-pay | Admitting: Urology

## 2024-04-20 NOTE — Progress Notes (Signed)
 04/15/2024 12:04 PM   Andrew Drake [birth year] [id number]  Referring provider: Vincente Shivers, NP [address] [place],  [place] [address]  Chief Complaint  Patient presents with   Nephrolithiasis    HPI: Andrew Drake is a 38 y.o. male who presents in follow-up of recent ED visit for renal colic.  Seen [facility] ED 03/07/2024 with a 2-day history of left flank pain.  Pain radiated to left lower quadrant and no identifiable precipitating, aggravating or alleviating factors Associated nausea without vomiting No fever, chills or lower urinary tract symptoms CT abdomen/pelvis without contrast showed an 8 mm distal ureteral calculus with moderate left hydronephrosis/upstream hydroureter.  Also noted to have a small nonobstructing left lower pole calculus. Creatinine slightly elevated 1.4.  Serum calcium normal 9.2.  Urinalysis was unremarkable. He received parenteral ketorolac with resolution of his pain.  Since ED visit has had intermittent left lower quadrant pain which has not been severe. [facility] ED visit 03/06/2023 with left renal colic and CT at that visit showed a 6 mm left proximal ureteral calculus with mild hydronephrosis.  He saw Dr. [facility] 03/16/2023 and had been asymptomatic for 1 week.  A follow-up CT was recommended and ordered however never performed.  He was not aware of passing the stone.   PMH: Past Medical History:  Diagnosis Date   Hyperlipidemia    Hypertension     Surgical History: Past Surgical History:  Procedure Laterality Date   APPENDECTOMY      Home Medications:  Allergies as of 04/15/2024       Reactions   Atorvastatin    impotency        Medication List        Accurate as of April 15, 2024 11:59 PM. If you have any questions, ask your nurse or doctor.          ezetimibe 10 MG tablet Commonly known as: ZETIA Take by mouth.   gemfibrozil  600 MG tablet Commonly known as: Lopid  Take 1 tablet (600 mg total) by mouth once  for 1 dose.        Allergies:  Allergies  Allergen Reactions   Atorvastatin     impotency    Family History: Family History  Problem Relation Age of Onset   Diabetes Mother    Diabetes Brother     Social History:  reports that he has never smoked. He does not have any smokeless tobacco history on file. He reports that he does not drink alcohol and does not use drugs.   Physical Exam: BP 115/68   Pulse 83   Ht 5' 8 (1.727 m)   Wt 200 lb (90.7 kg)   BMI 30.41 kg/m   Constitutional:  Alert, No acute distress. HEENT: [place] AT Respiratory: Normal respiratory effort, no increased work of breathing. Psychiatric: Normal mood and affect.  Laboratory Data: Dipstick/microscopy negative   Pertinent Imaging: CT images on [facility] were personally reviewed and interpreted.  There is a 8 x 9 mm left distal ureteral calculus with severe hydronephrosis/hydroureter.  Small, nonobstructing left lower pole renal calculus.  SSD <10 cm.  Stone density ~1400 HU   Assessment & Plan:    1.  Left distal ureteral calculus We discussed various treatment options for urolithiasis including observation with or without medical expulsive therapy, shockwave lithotripsy (SWL), ureteroscopy and laser lithotripsy with stent placement. We discussed that management is based on stone size, location, density, patient co-morbidities, and patient preference.  Stones <53mm  in size have a >80% spontaneous passage rate. Data surrounding the use of tamsulosin for medical expulsive therapy is controversial, but meta analyses suggests it is most efficacious for distal stones between 5-55mm in size. Possible side effects include dizziness/lightheadedness, and retrograde ejaculation. SWL has a lower stone free rate in a single procedure, but also a lower complication rate compared to ureteroscopy and avoids a stent and associated stent related symptoms. Possible complications include renal hematoma, steinstrasse, and  need for additional treatment. Ureteroscopy with laser lithotripsy and stent placement has a higher stone free rate than SWL in a single procedure, however increased complication rate including possible infection, ureteral injury, bleeding, and stent related morbidity. Common stent related symptoms include dysuria, urgency/frequency, and flank pain. This stone has been present for at least 1 month and the chances of him passing are unlikely.  This may be the same stone that was identified in the proximal ureter in 2024 as there were no other calculi in the kidney.  Based on stone density SWL may not be successful and feel ureteroscopy would be his best option.  He would like to think over these options and will touch base with the patient next week.   Glendia JAYSON Barba, MD  [facility] [address], [address] [place], [place] [address] [phone]

## 2024-04-22 ENCOUNTER — Telehealth: Payer: Self-pay | Admitting: Urology

## 2024-04-22 DIAGNOSIS — N201 Calculus of ureter: Secondary | ICD-10-CM

## 2024-04-22 NOTE — Telephone Encounter (Signed)
 The patient's wife came into the office today and informed us  that they discussed the options, and Andrew Drake has decided to proceed with shockwave lithotripsy (SWL) to treat his kidney stones. What would his next steps be to proceed?

## 2024-04-29 ENCOUNTER — Other Ambulatory Visit: Payer: Self-pay

## 2024-04-29 DIAGNOSIS — N201 Calculus of ureter: Secondary | ICD-10-CM

## 2024-04-29 NOTE — Progress Notes (Unsigned)
 Surgical Physician Order Form [facility]  Dr. Glendia Barba, MD  * Scheduling expectation : Next Available  *Length of Case: 60 minutes  *Clearance needed: no  *Anticoagulation Instructions: N/A  *Aspirin Instructions: N/A  *Post-op visit Date/Instructions:  1 week cysto stent removal  *Diagnosis: Left Ureteral Stone  *Procedure: left Ureteroscopy w/laser lithotripsy & stent placement (47643)   Additional orders: N/A  -Admit type: OUTpatient  -Anesthesia: General  -VTE Prophylaxis Standing Order SCD's       Other:   -Standing Lab Orders Per Anesthesia    Lab other: UA&Urine Culture  -Standing Test orders EKG/Chest x-ray per Anesthesia       Test other:   - Medications:  Ancef 2gm IV  -Other orders:  N/A

## 2024-04-30 ENCOUNTER — Telehealth: Payer: Self-pay

## 2024-04-30 NOTE — Telephone Encounter (Signed)
 Per Dr. Twylla, Patient is to be scheduled for Left Ureteroscopy with Laser Lithotripsy and Stent Placement   Mr. Glomb was contacted and possible surgical dates were discussed, Thursday October 30th, 2025 was agreed upon for surgery.   Patient was instructed that Dr. Twylla will require them to provide a pre-op UA & CX prior to surgery. This was ordered and scheduled drop off appointment was made for 06/12/2024.    Patient was directed to call [phone] between 1-3pm the day before surgery to find out surgical arrival time.  Instructions were given not to eat or drink from midnight on the night before surgery and have a driver for the day of surgery. On the surgery day patient was instructed to enter through the [facility] entrance of [facility] report the Same Day Surgery desk.   Pre-Admit Testing will be in contact via phone to set up an interview with the anesthesia team to review your history and medications prior to surgery.   Reminder of this information was sent via MyChart to the patient.

## 2024-04-30 NOTE — Progress Notes (Signed)
   [facility] Surgical Posting Form  Surgery Date: Date: 06/26/2024  Surgeon: Dr. Glendia Barba, MD  Inpt ( No  )   Outpt (Yes)   Obs ( No  )   Diagnosis: N20.1 Left Ureteral Stone  -CPT: [id number]  Surgery: Left Ureteroscopy with Laser Lithotripsy and Stent Placement  Stop Anticoagulations: No  Cardiac/Medical/Pulmonary Clearance needed: no  *Orders entered into EPIC  Date: 04/30/24   *Case booked in [place]  Date: 04/30/24  *Notified pt of Surgery: Date: 04/30/24  PRE-OP UA & CX: yes, will obtain in clinic on 06/12/2024  *Placed into Prior Authorization Work [place] Date: 04/30/24  Assistant/laser/rep:No

## 2024-05-01 ENCOUNTER — Ambulatory Visit: Admitting: General Practice

## 2024-05-01 ENCOUNTER — Encounter: Payer: Self-pay | Admitting: General Practice

## 2024-05-01 VITALS — BP 126/82 | HR 71 | Temp 98.7°F | Ht 68.0 in | Wt 199.8 lb

## 2024-05-01 DIAGNOSIS — E782 Mixed hyperlipidemia: Secondary | ICD-10-CM | POA: Diagnosis not present

## 2024-05-01 DIAGNOSIS — Z23 Encounter for immunization: Secondary | ICD-10-CM

## 2024-05-01 DIAGNOSIS — N201 Calculus of ureter: Secondary | ICD-10-CM

## 2024-05-01 DIAGNOSIS — I10 Essential (primary) hypertension: Secondary | ICD-10-CM | POA: Diagnosis not present

## 2024-05-01 MED ORDER — GEMFIBROZIL 600 MG PO TABS
600.0000 mg | ORAL_TABLET | Freq: Every day | ORAL | 1 refills | Status: AC
Start: 2024-05-01 — End: ?

## 2024-05-01 MED ORDER — KETOROLAC TROMETHAMINE 10 MG PO TABS: 10.0000 mg | ORAL_TABLET | Freq: Four times a day (QID) | ORAL | 0 refills | Status: AC | PRN

## 2024-05-01 MED ORDER — EZETIMIBE 10 MG PO TABS
10.0000 mg | ORAL_TABLET | Freq: Every day | ORAL | 1 refills | Status: AC
Start: 2024-05-01 — End: ?

## 2024-05-01 NOTE — Progress Notes (Addendum)
 Established Patient Office Visit  Subjective   Patient ID: Andrew Drake, male    DOB: October 25, 1985  Age: 38 y.o. MRN: [id number]  Chief Complaint  Patient presents with   Medication Refill    Needs both rxs refilled and needs cholesterol recheck.     Medication Refill Pertinent negatives include no abdominal pain, chest pain, chills, fever, headaches, nausea or vomiting.    Discussed the use of AI scribe software for clinical note transcription with the patient, who gave verbal consent to proceed.  History of Present Illness Andrew Drake is a 38 year old male with hyperlipidemia and kidney stones who presents for medication refills and follow-up on his kidney condition.  He has been taking ezetimibe  and gemfibrozil  for hyperlipidemia, with gemfibrozil  at a dose of 600 mg daily. He has been tolerating his medications well. He has lost weight since his last visit, dropping from 208 lbs to 199 lbs, attributed to dietary changes and increased physical activity. No chest pain, dizziness, nausea, or vomiting.  He recently experienced a kidney stone episode, leading to a hospital visit at [facility] and a subsequent referral to urology. Surgery was initially scheduled for earlier this month but was postponed due to personal commitments, including a wedding and a cruise. The pain is intermittent, often triggered by dehydration or physical activity, and subsides with increased water intake. He has been drinking lemon water to manage symptoms. He was prescribed Toradol  for pain management during his hospital visit and uses it sparingly, only when experiencing significant pain. No blood in urine, urinary urgency, or frequency. He would like a  refill on the Toradol  due to the trips he has coming up.      Patient Active Problem List   Diagnosis Date Noted   Encounter for screening and preventative care 11/19/2023   Hypertension 11/19/2023   Mixed hyperlipidemia 09/05/2021   Past Medical  History:  Diagnosis Date   Hyperlipidemia    Hypertension    Past Surgical History:  Procedure Laterality Date   APPENDECTOMY     Allergies  Allergen Reactions   Atorvastatin     impotency         01/03/2024    8:15 AM 12/19/2023    8:22 AM 11/19/2023    9:55 AM  Depression screen PHQ 2/9  Decreased Interest 0 0 0  Down, Depressed, Hopeless 0 0 0  PHQ - 2 Score 0 0 0  Altered sleeping 0 0 0  Tired, decreased energy 0 0 0  Change in appetite 0 0 0  Feeling bad or failure about yourself  0 0 0  Trouble concentrating 0 0 0  Moving slowly or fidgety/restless 0 0 0  Suicidal thoughts 0 0 0  PHQ-9 Score 0 0 0  Difficult doing work/chores Not difficult at all Not difficult at all Not difficult at all       01/03/2024    8:15 AM 12/19/2023    8:22 AM 11/19/2023    9:55 AM  GAD 7 : Generalized Anxiety Score  Nervous, Anxious, on Edge 0 0 0  Control/stop worrying 0 0 0  Worry too much - different things 0 0 0  Trouble relaxing 0 0 0  Restless 0 0 0  Easily annoyed or irritable 0 0 0  Afraid - awful might happen 0 0 0  Total GAD 7 Score 0 0 0  Anxiety Difficulty Not difficult at all Not difficult at all Not difficult at all  Review of Systems  Constitutional:  Negative for chills and fever.  Respiratory:  Negative for shortness of breath.   Cardiovascular:  Negative for chest pain.  Gastrointestinal:  Negative for abdominal pain, constipation, diarrhea, heartburn, nausea and vomiting.  Genitourinary:  Positive for flank pain. Negative for dysuria, frequency, hematuria and urgency.  Neurological:  Negative for dizziness and headaches.  Endo/Heme/Allergies:  Negative for polydipsia.  Psychiatric/Behavioral:  Negative for depression and suicidal ideas. The patient is not nervous/anxious.       Objective:     BP 126/82   Pulse 71   Temp 98.7 F (37.1 C) (Oral)   Ht 5' 8 (1.727 m)   Wt 199 lb 12.8 oz (90.6 kg)   SpO2 97%   BMI 30.38 kg/m  BP Readings from  Last 3 Encounters:  05/01/24 126/82  04/15/24 115/68  01/03/24 128/80   Wt Readings from Last 3 Encounters:  05/01/24 199 lb 12.8 oz (90.6 kg)  04/15/24 200 lb (90.7 kg)  01/03/24 208 lb (94.3 kg)      Physical Exam Vitals and nursing note reviewed.  Constitutional:      Appearance: Normal appearance.  Cardiovascular:     Rate and Rhythm: Normal rate and regular rhythm.     Pulses: Normal pulses.     Heart sounds: Normal heart sounds.  Pulmonary:     Effort: Pulmonary effort is normal.     Breath sounds: Normal breath sounds.  Neurological:     Mental Status: He is alert and oriented to person, place, and time.  Psychiatric:        Mood and Affect: Mood normal.        Behavior: Behavior normal.        Thought Content: Thought content normal.        Judgment: Judgment normal.      No results found for any visits on 05/01/24.     The ASCVD Risk score (Arnett DK, et al., 2019) failed to calculate for the following reasons:   The 2019 ASCVD risk score is only valid for ages 42 to 49    Assessment & Plan:  Mixed hyperlipidemia -     Ezetimibe ; Take 1 tablet (10 mg total) by mouth daily.  Dispense: 90 tablet; Refill: 1 -     Gemfibrozil ; Take 1 tablet (600 mg total) by mouth daily.  Dispense: 90 tablet; Refill: 1 -     Lipid panel; Future  Primary hypertension  Encounter for immunization  Ureteral calculus, left -     Ketorolac  Tromethamine ; Take 1 tablet (10 mg total) by mouth every 6 (six) hours as needed.  Dispense: 20 tablet; Refill: 0  Need for influenza vaccination -     Flu vaccine HIGH DOSE PF(Fluzone Trivalent)    Assessment and Plan Assessment & Plan Mixed hyperlipidemia Managed with ezetimibe  and gemfibrozil . Clarified gemfibrozil  prescription issue. Emphasized fasting before cholesterol test for accuracy. - Refill sent for ezetimibe   - Refill sent for gemfibrozil  600 mg once daily.  - Lipid panel pending.  Left ureteral calculus Intermittent  pain managed with hydration and pain medication. Surgery postponed. Discussed hydration to aid stone passage. - Refill sent for Toradol  10 mg q 6 hours as needed.  - advised to use sparingly.  - Encourage increased water intake to facilitate passing the stone.   Return in about 7 months (around 11/19/2024) for physical and same day fasting labs.SABRA Carrol Aurora, NP

## 2024-05-01 NOTE — Patient Instructions (Addendum)
 Make a lab ONLY appt before you leave.   Refills sent to the pharmacy.   Follow up in 6 months for physical. Please come fasting.   It was a pleasure to see you today!

## 2024-05-02 ENCOUNTER — Other Ambulatory Visit

## 2024-05-02 ENCOUNTER — Ambulatory Visit: Payer: Self-pay | Admitting: General Practice

## 2024-05-02 DIAGNOSIS — E782 Mixed hyperlipidemia: Secondary | ICD-10-CM | POA: Diagnosis not present

## 2024-05-02 LAB — LIPID PANEL
Cholesterol: 181 mg/dL (ref 0–200)
HDL: 43.5 mg/dL (ref >39.00)
LDL Cholesterol: 117 mg/dL — ABNORMAL HIGH (ref 0–99)
NonHDL: 137.56
Total CHOL/HDL Ratio: 4
Triglycerides: 104 mg/dL (ref 0.0–149.0)
VLDL: 20.8 mg/dL (ref 0.0–40.0)

## 2024-05-22 ENCOUNTER — Ambulatory Visit: Admitting: General Practice

## 2024-06-03 ENCOUNTER — Other Ambulatory Visit

## 2024-06-12 ENCOUNTER — Other Ambulatory Visit

## 2024-06-16 ENCOUNTER — Other Ambulatory Visit

## 2024-06-16 DIAGNOSIS — N201 Calculus of ureter: Secondary | ICD-10-CM

## 2024-06-16 LAB — URINALYSIS, COMPLETE
Bilirubin, UA: NEGATIVE
Glucose, UA: NEGATIVE
Ketones, UA: NEGATIVE
Leukocytes,UA: NEGATIVE
Nitrite, UA: NEGATIVE
Protein,UA: NEGATIVE
Specific Gravity, UA: 1.02 (ref 1.005–1.030)
Urobilinogen, Ur: 0.2 mg/dL (ref 0.2–1.0)
pH, UA: 6.5 (ref 5.0–7.5)

## 2024-06-16 LAB — MICROSCOPIC EXAMINATION

## 2024-06-18 LAB — CULTURE, URINE COMPREHENSIVE

## 2024-06-19 ENCOUNTER — Other Ambulatory Visit: Payer: Self-pay

## 2024-06-19 ENCOUNTER — Encounter
Admission: RE | Admit: 2024-06-19 | Discharge: 2024-06-19 | Disposition: A | Discharge status: Home / Self Care | Source: Ambulatory Visit | Attending: Urology | Admitting: Urology

## 2024-06-19 HISTORY — DX: Personal history of urinary calculi: Z87.442

## 2024-06-19 NOTE — Patient Instructions (Addendum)
 Your procedure is scheduled on: Thursday 06/26/24 Report to the Registration Desk on the 1st floor of the [facility]. To find out your arrival time, please call [phone] between 1PM - 3PM on: Wednesday 06/25/24 If your arrival time is 6:00 am, do not arrive before that time as the [facility] entrance doors do not open until 6:00 am.  REMEMBER: Instructions that are not followed completely may result in serious medical risk, up to and including death; or upon the discretion of your surgeon and anesthesiologist your surgery may need to be rescheduled.  Do not eat food or drink any liquids after midnight the night before surgery.  No gum chewing or hard candies.  One week prior to surgery: Stop Anti-inflammatories (NSAIDS) such as Advil , Aleve, Ibuprofen , Motrin , Naproxen, Naprosyn and Aspirin based products such as Excedrin, Goody's Powder, BC Powder. Stop your etodolac/Toradol  today.  You may however, continue to take Tylenol if needed for pain up until the day of surgery.  Stop ANY OVER THE COUNTER supplements and vitamins until after surgery.  Continue taking all of your other prescription medications up until the day of surgery.  ON THE DAY OF SURGERY ONLY TAKE THESE MEDICATIONS WITH SIPS OF WATER:  ezetimibe  (ZETIA ) 10 MG tablet   No Alcohol for 24 hours before or after surgery.  No Smoking including e-cigarettes for 24 hours before surgery.  No chewable tobacco products for at least 6 hours before surgery.  No nicotine patches on the day of surgery.  Do not use any recreational drugs for at least a week (preferably 2 weeks) before your surgery.  Please be advised that the combination of cocaine and anesthesia may have negative outcomes, up to and including death. If you test positive for cocaine, your surgery will be cancelled.  On the morning of surgery brush your teeth with toothpaste and water, you may rinse your mouth with mouthwash if you wish. Do not swallow  any toothpaste or mouthwash.  Shower prior to coming for surgery.  Do not wear lotions, powders, or perfumes.   Do not shave body hair from the neck down 48 hours before surgery.  Wear comfortable clothing (specific to your surgery type) to the hospital.  Do not wear jewelry, make-up, hairpins, clips or nail polish.  For welded (permanent) jewelry: bracelets, anklets, waist bands, etc.  Please have this removed prior to surgery.  If it is not removed, there is a chance that hospital personnel will need to cut it off on the day of surgery.  Contact lenses, hearing aids and dentures may not be worn into surgery.  Do not bring valuables to the hospital. [facility] is not responsible for any missing/lost belongings or valuables.   Notify your doctor if there is any change in your medical condition (cold, fever, infection).  After surgery, you can help prevent lung complications by doing breathing exercises.  Take deep breaths and cough every 1-2 hours. Your doctor may order a device called an Incentive Spirometer to help you take deep breaths.  If you are being discharged the day of surgery, you will not be allowed to drive home. You will need a responsible individual to drive you home and stay with you for 24 hours after surgery.   Please call the Pre-admissions Testing Dept. at [phone] if you have any questions about these instructions.  Surgery Visitation Policy:  Patients having surgery or a procedure may have two visitors.  Children under the age of 36 must have  an adult with them who is not the patient.   Merchandiser, retail to address health-related social needs:  [email or web address]

## 2024-06-19 NOTE — Pre-Procedure Instructions (Signed)
 Patient scheduled for phone interview but came in to office. Interview done  in person.

## 2024-06-26 ENCOUNTER — Other Ambulatory Visit: Payer: Self-pay

## 2024-06-26 ENCOUNTER — Ambulatory Visit

## 2024-06-26 ENCOUNTER — Ambulatory Visit: Admitting: Anesthesiology

## 2024-06-26 ENCOUNTER — Encounter
Admission: RE | Disposition: A | Discharge status: Home / Self Care | Payer: Self-pay | Source: Home / Self Care | Attending: Urology

## 2024-06-26 ENCOUNTER — Encounter: Payer: Self-pay | Admitting: Urology

## 2024-06-26 ENCOUNTER — Ambulatory Visit
Admission: RE | Admit: 2024-06-26 | Discharge: 2024-06-26 | Disposition: A | Discharge status: Home / Self Care | Attending: Urology | Admitting: Urology

## 2024-06-26 DIAGNOSIS — N202 Calculus of kidney with calculus of ureter: Secondary | ICD-10-CM | POA: Insufficient documentation

## 2024-06-26 DIAGNOSIS — N201 Calculus of ureter: Secondary | ICD-10-CM

## 2024-06-26 DIAGNOSIS — N200 Calculus of kidney: Secondary | ICD-10-CM

## 2024-06-26 HISTORY — PX: CYSTOSCOPY/URETEROSCOPY/HOLMIUM LASER/STENT PLACEMENT: SHX6546

## 2024-06-26 SURGERY — CYSTOSCOPY/URETEROSCOPY/HOLMIUM LASER/STENT PLACEMENT
Anesthesia: General | Site: Ureter | Laterality: Left

## 2024-06-26 MED ORDER — ORAL CARE MOUTH RINSE: 15.0000 mL | Freq: Once | OROMUCOSAL | Status: AC

## 2024-06-26 MED ORDER — CEFAZOLIN SODIUM-DEXTROSE 2-4 GM/100ML-% IV SOLN
INTRAVENOUS | Status: AC
Start: 2024-06-26 — End: 2024-06-26
  Filled 2024-06-26: qty 100

## 2024-06-26 MED ORDER — KETOROLAC TROMETHAMINE 30 MG/ML IJ SOLN
INTRAMUSCULAR | Status: DC | PRN
  Administered 2024-06-26: 30 mg via INTRAVENOUS

## 2024-06-26 MED ORDER — FENTANYL CITRATE (PF) 100 MCG/2ML IJ SOLN
INTRAMUSCULAR | Status: DC | PRN
  Administered 2024-06-26: 25 ug via INTRAVENOUS
  Administered 2024-06-26: 50 ug via INTRAVENOUS
  Administered 2024-06-26: 25 ug via INTRAVENOUS

## 2024-06-26 MED ORDER — OXYCODONE HCL 5 MG/5ML PO SOLN: 5.0000 mg | Freq: Once | ORAL | Status: AC | PRN

## 2024-06-26 MED ORDER — STERILE WATER FOR IRRIGATION IR SOLN
Status: DC | PRN
  Administered 2024-06-26: 500 mL

## 2024-06-26 MED ORDER — DROPERIDOL 2.5 MG/ML IJ SOLN: 0.6250 mg | Freq: Once | INTRAMUSCULAR | Status: DC | PRN

## 2024-06-26 MED ORDER — OXYCODONE HCL 5 MG PO TABS
5.0000 mg | ORAL_TABLET | Freq: Once | ORAL | Status: AC | PRN
  Administered 2024-06-26: 5 mg via ORAL

## 2024-06-26 MED ORDER — IOHEXOL 180 MG/ML  SOLN
INTRAMUSCULAR | Status: DC | PRN
  Administered 2024-06-26: 10 mL

## 2024-06-26 MED ORDER — GLYCOPYRROLATE 0.2 MG/ML IJ SOLN
INTRAMUSCULAR | Status: AC
  Filled 2024-06-26: qty 1

## 2024-06-26 MED ORDER — KETOROLAC TROMETHAMINE 30 MG/ML IJ SOLN
INTRAMUSCULAR | Status: AC
  Filled 2024-06-26: qty 1

## 2024-06-26 MED ORDER — ONDANSETRON HCL 4 MG/2ML IJ SOLN
INTRAMUSCULAR | Status: DC | PRN
Start: 2024-06-26 — End: 2024-06-26
  Administered 2024-06-26: 4 mg via INTRAVENOUS

## 2024-06-26 MED ORDER — ONDANSETRON HCL 4 MG/2ML IJ SOLN
INTRAMUSCULAR | Status: AC
Start: 2024-06-26 — End: 2024-06-26
  Filled 2024-06-26: qty 2

## 2024-06-26 MED ORDER — MIDAZOLAM HCL (PF) 2 MG/2ML IJ SOLN
INTRAMUSCULAR | Status: DC | PRN
  Administered 2024-06-26: 2 mg via INTRAVENOUS

## 2024-06-26 MED ORDER — LACTATED RINGERS IV SOLN: INTRAVENOUS | Status: DC

## 2024-06-26 MED ORDER — ACETAMINOPHEN 10 MG/ML IV SOLN
INTRAVENOUS | Status: DC | PRN
  Administered 2024-06-26: 1000 mg via INTRAVENOUS

## 2024-06-26 MED ORDER — MIDAZOLAM HCL 2 MG/2ML IJ SOLN
INTRAMUSCULAR | Status: AC
  Filled 2024-06-26: qty 2

## 2024-06-26 MED ORDER — FENTANYL CITRATE (PF) 100 MCG/2ML IJ SOLN: 25.0000 ug | INTRAMUSCULAR | Status: DC | PRN

## 2024-06-26 MED ORDER — FENTANYL CITRATE (PF) 100 MCG/2ML IJ SOLN
INTRAMUSCULAR | Status: AC
  Filled 2024-06-26: qty 2

## 2024-06-26 MED ORDER — TAMSULOSIN HCL 0.4 MG PO CAPS: 0.4000 mg | ORAL_CAPSULE | Freq: Every day | ORAL | 0 refills | Status: AC

## 2024-06-26 MED ORDER — DEXAMETHASONE SOD PHOSPHATE PF 10 MG/ML IJ SOLN
INTRAMUSCULAR | Status: DC | PRN
  Administered 2024-06-26: 8 mg via INTRAVENOUS

## 2024-06-26 MED ORDER — ACETAMINOPHEN 10 MG/ML IV SOLN
INTRAVENOUS | Status: AC
  Filled 2024-06-26: qty 100

## 2024-06-26 MED ORDER — CHLORHEXIDINE GLUCONATE 0.12 % MT SOLN
OROMUCOSAL | Status: AC
  Filled 2024-06-26: qty 15

## 2024-06-26 MED ORDER — ACETAMINOPHEN 10 MG/ML IV SOLN: 1000.0000 mg | Freq: Once | INTRAVENOUS | Status: DC | PRN

## 2024-06-26 MED ORDER — CEFAZOLIN SODIUM-DEXTROSE 2-4 GM/100ML-% IV SOLN
2.0000 g | INTRAVENOUS | Status: AC
  Administered 2024-06-26: 2 g via INTRAVENOUS

## 2024-06-26 MED ORDER — SODIUM CHLORIDE 0.9 % IR SOLN
Status: DC | PRN
  Administered 2024-06-26 (×2): 3000 mL

## 2024-06-26 MED ORDER — PROPOFOL 10 MG/ML IV BOLUS
INTRAVENOUS | Status: DC | PRN
  Administered 2024-06-26: 160 mg via INTRAVENOUS
  Administered 2024-06-26: 20 mg via INTRAVENOUS

## 2024-06-26 MED ORDER — LIDOCAINE HCL (CARDIAC) PF 100 MG/5ML IV SOSY
PREFILLED_SYRINGE | INTRAVENOUS | Status: DC | PRN
  Administered 2024-06-26: 100 mg via INTRAVENOUS

## 2024-06-26 MED ORDER — OXYCODONE HCL 5 MG PO TABS
ORAL_TABLET | ORAL | Status: AC
  Filled 2024-06-26: qty 1

## 2024-06-26 MED ORDER — OXYBUTYNIN CHLORIDE 5 MG PO TABS: ORAL_TABLET | ORAL | 0 refills | Status: AC

## 2024-06-26 MED ORDER — LIDOCAINE HCL (PF) 2 % IJ SOLN
INTRAMUSCULAR | Status: AC
  Filled 2024-06-26: qty 5

## 2024-06-26 MED ORDER — PROPOFOL 10 MG/ML IV BOLUS
INTRAVENOUS | Status: AC
Start: 2024-06-26 — End: 2024-06-26
  Filled 2024-06-26: qty 40

## 2024-06-26 MED ORDER — CHLORHEXIDINE GLUCONATE 0.12 % MT SOLN
15.0000 mL | Freq: Once | OROMUCOSAL | Status: AC
  Administered 2024-06-26: 15 mL via OROMUCOSAL

## 2024-06-26 SURGICAL SUPPLY — 23 items
ADHESIVE MASTISOL STRL (MISCELLANEOUS) IMPLANT
BAG DRAIN SIEMENS DORNER NS (MISCELLANEOUS) ×1 IMPLANT
BASKET ZERO TIP 1.9FR (BASKET) IMPLANT
BRUSH SCRUB EZ 4% CHG (MISCELLANEOUS) ×1 IMPLANT
CATH URET FLEX-TIP 2 LUMEN 10F (CATHETERS) IMPLANT
CATH URETL OPEN END 6X70 (CATHETERS) IMPLANT
DRAPE UTILITY 15X26 TOWEL STRL (DRAPES) ×1 IMPLANT
DRSG TEGADERM 2-3/8X2-3/4 SM (GAUZE/BANDAGES/DRESSINGS) IMPLANT
FIBER LASER MOSES 200 DFL (Laser) IMPLANT
GLOVE BIOGEL PI IND STRL 7.5 (GLOVE) ×1 IMPLANT
GOWN STRL REUS W/ TWL LRG LVL3 (GOWN DISPOSABLE) ×1 IMPLANT
GOWN STRL REUS W/ TWL XL LVL3 (GOWN DISPOSABLE) ×1 IMPLANT
GUIDEWIRE STR DUAL SENSOR (WIRE) ×1 IMPLANT
KIT TURNOVER CYSTO (KITS) ×1 IMPLANT
PACK CYSTO AR (MISCELLANEOUS) ×1 IMPLANT
SET CYSTO IRRIGATION (SET/KITS/TRAYS/PACK) ×1 IMPLANT
SHEATH NAVIGATOR HD 12/14X36 (SHEATH) IMPLANT
SOL .9 NS 3000ML IRR UROMATIC (IV SOLUTION) ×1 IMPLANT
STENT URET 6FRX24 CONTOUR (STENTS) IMPLANT
STENT URET 6FRX26 CONTOUR (STENTS) IMPLANT
SURGILUBE 2OZ TUBE FLIPTOP (MISCELLANEOUS) ×1 IMPLANT
VALVE UROSEAL ADJ ENDO (VALVE) IMPLANT
WATER STERILE IRR 500ML POUR (IV SOLUTION) ×1 IMPLANT

## 2024-06-26 NOTE — Transfer of Care (Signed)
 Immediate Anesthesia Transfer of Care Note  Patient: Andrew Drake  Procedure(s) Performed: CYSTOSCOPY/URETEROSCOPY/HOLMIUM LASER/STENT PLACEMENT (Left: Ureter)  Patient Location: PACU  Anesthesia Type:General  Level of Consciousness: drowsy  Airway & Oxygen Therapy: Patient Spontanous Breathing and Patient connected to face mask oxygen  Post-op Assessment: Report given to RN and Post -op Vital signs reviewed and stable  Post vital signs: Reviewed and stable  Last Vitals:  Vitals Value Taken Time  BP 97/67 06/26/24 08:55  Temp 36.1 C 06/26/24 08:55  Pulse 66 06/26/24 08:57  Resp 9 06/26/24 08:57  SpO2 100 % 06/26/24 08:57  Vitals shown include unfiled device data.  Last Pain:  Vitals:   06/26/24 0624  TempSrc: Temporal  PainSc: 0-No pain         Complications: No notable events documented.

## 2024-06-26 NOTE — H&P (Signed)
   Urology H&P   Assessment/Recommendations:  1.  Left distal ureteral calculus Scheduled for left ureteroscopy with laser lithotripsy/stone removal/stent placement.  The procedure has been discussed in detail as per my previous note 04/15/2024.  All questions were answered and he desires to proceed  History of Present Illness: Andrew Drake is a 38 y.o. with an 8 mm left distal ureteral calculus.  Refer to prior office note 04/15/2024.  He is not aware of passing a stone.  Has had intermittent pain in the last earlier this week  Past Medical History:  Diagnosis Date   History of kidney stones    Hyperlipidemia    Hypertension    due to energy drinks BP normalized after stopping use never put on meds    Past Surgical History:  Procedure Laterality Date   APPENDECTOMY      Home Medications:  Current Meds  Medication Sig   acetaminophen (TYLENOL) 325 MG tablet Take 650 mg by mouth every 6 (six) hours as needed for moderate pain (pain score 4-6).   ezetimibe  (ZETIA ) 10 MG tablet Take 1 tablet (10 mg total) by mouth daily.   gemfibrozil  (LOPID ) 600 MG tablet Take 1 tablet (600 mg total) by mouth daily.   ketorolac  (TORADOL ) 10 MG tablet Take 1 tablet (10 mg total) by mouth every 6 (six) hours as needed.    Allergies:  Allergies  Allergen Reactions   Atorvastatin     impotency    Family History  Problem Relation Age of Onset   Diabetes Mother    Diabetes Brother     Social History:  reports that he has never smoked. He does not have any smokeless tobacco history on file. He reports that he does not drink alcohol and does not use drugs.  ROS: No fever, chills, shortness of breath  Physical Exam:  Vital signs in last 24 hours: Temp:  [97.4 F (36.3 C)] 97.4 F (36.3 C) (10/30 0624) Pulse Rate:  [68] 68 (10/30 0624) Resp:  [17] 17 (10/30 0624) BP: (118)/(65) 118/65 (10/30 0624) SpO2:  [98 %] 98 % (10/30 0624) Weight:  [94.8 kg] 94.8 kg (10/30 9375) Constitutional:   Alert and oriented, No acute distress HEENT: [place] AT Respiratory: Normal respiratory effort, lungs clear bilaterally Neurologic: Grossly intact, no focal deficits, moving all 4 extremities Psychiatric: Normal mood and affect     06/26/2024, 7:02 AM  Glendia Barba,  MD

## 2024-06-26 NOTE — Interval H&P Note (Signed)
 History and Physical Interval Note:  06/26/2024 7:04 AM  Andrew Drake  has presented today for surgery, with the diagnosis of Left Ureteral Stone.  The various methods of treatment have been discussed with the patient and family. After consideration of risks, benefits and other options for treatment, the patient has consented to  Procedure(s): CYSTOSCOPY/URETEROSCOPY/HOLMIUM LASER/STENT PLACEMENT (Left) as a surgical intervention.  The patient's history has been reviewed, patient examined, no change in status, stable for surgery.  I have reviewed the patient's chart and labs.  Questions were answered to the patient's satisfaction.    CV: RRR Lungs: Clear  Andrew Drake

## 2024-06-26 NOTE — Anesthesia Procedure Notes (Signed)
 Procedure Name: LMA Insertion Date/Time: 06/26/2024 7:35 AM  Performed by: Duwayne Craven, CRNAPre-anesthesia Checklist: Patient identified, Patient being monitored, Timeout performed, Emergency Drugs available and Suction available Patient Re-evaluated:Patient Re-evaluated prior to induction Oxygen Delivery Method: Circle system utilized Preoxygenation: Pre-oxygenation with 100% oxygen Induction Type: IV induction Ventilation: Mask ventilation without difficulty LMA: LMA inserted LMA Size: 4.0 Tube type: Oral Number of attempts: 1 Placement Confirmation: positive ETCO2 and breath sounds checked- equal and bilateral Tube secured with: Tape Dental Injury: Teeth and Oropharynx as per pre-operative assessment

## 2024-06-26 NOTE — Op Note (Signed)
 Preoperative diagnosis:  Left distal ureteral calculus Left nephrolithiasis  Postoperative diagnosis:  Left distal ureteral calculus Left nephrolithiasis  Procedure:  Cystoscopy Left ureteroscopy and stone removal Ureteroscopic laser lithotripsy Left ureteral stent placement (54F/26 cm) Left retrograde pyelography with interpretation  Surgeon: Glendia C. Gennette Shadix, M.D.  Anesthesia: General  Complications: None  Intraoperative findings:  Cystoscopy: Urethra normal in caliber without stricture; prostate nonocclusive.  Bladder mucosa without solid or papillary lesions; UOs normal-appearing bilaterally. Ureteroscopy:  Non-impacted upper portion of distal ureter-dusted with holmium laser Several small lower calyceal calculi 2-3 mm in size-dusted with holmium laser Left retrograde pyelography demonstrated moderate hydronephrosis  EBL: Minimal  Specimens: Calculus fragments for analysis   Indication: Andrew Drake is a 38 y.o.  male with an 8 mm left distal ureteral calculus with intermittent renal colic.  CT also showed nonobstructing left lower calyceal calculi.  Refer to admission H&P for details.  After reviewing the management options for treatment, the patient elected to proceed with the above surgical procedure(s). We have discussed the potential benefits and risks of the procedure, side effects of the proposed treatment, the likelihood of the patient achieving the goals of the procedure, and any potential problems that might occur during the procedure or recuperation. Informed consent has been obtained.  Description of procedure:  The patient was taken to the operating room and general anesthesia was induced.  The patient was placed in the dorsal lithotomy position, prepped and draped in the usual sterile fashion, and preoperative antibiotics were administered. A preoperative time-out was performed.   A 21 French cystoscope was lubricated, placed per urethra and advanced into  the bladder under direct vision with findings as described above.    Attention was directed to the left ureteral orifice and a 0.038 Sensor wire was then advanced up the ureter into the renal pelvis under fluoroscopic guidance.  The calculus was easily visualized on fluoroscopy  A 4.5 Fr semirigid ureteroscope was then advanced into the ureter next to the guidewire and the calculus was identified.  The stone was then dusted with a 200 m Moses holmium laser fiber at a setting of 0.3 J/40 hz and subsequently increased to 0.3 J/80 hz.   All fragments larger than 2 mm were then removed from the ureter with a zero tip nitinol basket.  Reinspection of the ureter revealed no significantly sized fragments.   The semirigid ureteroscope was removed and a single channel digital flexible ureteroscope was placed over the guidewire and advanced to the mid ureter.  The guidewire was removed and the ureteroscope was advanced in the renal pelvis under direct vision.  Contrast was instilled through the ureteroscope with findings as described above.  All calyces were examined under fluoroscopic guidance and a grouping of ~5 small calculi was identified and the lower calyx.  These were dusted with the holmium laser at a setting of 0.3 J/80 Hz  The ureteroscope was removed under direct vision and no significant sized fragments were identified.  A 6 F/26 cm Contour ureteral stent without tether was placed under fluoroscopic guidance.  The wire was then removed with an adequate stent curl noted in the renal pelvis as well as in the bladder.  The tether was secured to the dorsum of the penis with Mastisol and Tegaderm  The bladder was then emptied and the procedure ended.  The patient appeared to tolerate the procedure well and without complications.  After anesthetic reversal the patient was transported to the PACU in stable condition.  Plan: He may remove his stent on Sunday, 06/29/2024 Postop follow-up will be  scheduled in ~1 month   Glendia Barba, MD

## 2024-06-26 NOTE — Anesthesia Preprocedure Evaluation (Addendum)
 Anesthesia Evaluation  Patient identified by MRN, date of birth, ID band Patient awake    Reviewed: Allergy & Precautions, H&P , NPO status , Patient's Chart, lab work & pertinent test results  Airway Mallampati: II  TM Distance: >3 FB Neck ROM: full    Dental no notable dental hx.    Pulmonary neg pulmonary ROS   Pulmonary exam normal        Cardiovascular negative cardio ROS Normal cardiovascular exam     Neuro/Psych negative neurological ROS  negative psych ROS   GI/Hepatic negative GI ROS, Neg liver ROS,,,  Endo/Other  negative endocrine ROS    Renal/GU      Musculoskeletal   Abdominal  (+) + obese  Peds  Hematology negative hematology ROS (+)   Anesthesia Other Findings Past Medical History: No date: History of kidney stones No date: Hyperlipidemia No date: Hypertension     Comment:  due to energy drinks BP normalized after stopping use               never put on meds  Past Surgical History: No date: APPENDECTOMY  BMI    Body Mass Index: 31.78 kg/m      Reproductive/Obstetrics negative OB ROS                              Anesthesia Physical Anesthesia Plan  ASA: 2  Anesthesia Plan: General LMA   Post-op Pain Management: Toradol  IV (intra-op)* and Ofirmev IV (intra-op)*   Induction: Intravenous  PONV Risk Score and Plan: Dexamethasone, Ondansetron and Midazolam  Airway Management Planned: LMA  Additional Equipment:   Intra-op Plan:   Post-operative Plan: Extubation in OR  Informed Consent: I have reviewed the patients History and Physical, chart, labs and discussed the procedure including the risks, benefits and alternatives for the proposed anesthesia with the patient or authorized representative who has indicated his/her understanding and acceptance.     Dental Advisory Given  Plan Discussed with: Anesthesiologist, CRNA and Surgeon  Anesthesia Plan  Comments:          Anesthesia Quick Evaluation

## 2024-06-26 NOTE — Discharge Instructions (Signed)
 DISCHARGE INSTRUCTIONS FOR KIDNEY STONE/URETERAL STENT   MEDICATIONS:  1. Resume all your other meds from home.  2.  AZO (over-the-counter) can help with the burning/stinging when you urinate. 3.  Oxybutynin and tamsulosin are for bladder/stent irritation, Rxs were sent to your pharmacy.  ACTIVITY:  1. May resume regular activities in 24 hours. 2. No driving while on narcotic pain medications  3. Drink plenty of water  4. Continue to walk at home - you can still get blood clots when you are at home, so keep active, but don't over do it.  5. May return to work/school tomorrow or when you feel ready   BATHING:  1. You can shower. 2. You have a string coming from your urethra: The stent string is attached to your ureteral stent. Do not pull on this.   SIGNS/SYMPTOMS TO CALL:  Common postoperative symptoms include urinary frequency, urgency, bladder spasm and blood in the urine  Please call us  if you have a fever greater than 101.5, uncontrolled nausea/vomiting, uncontrolled pain, dizziness, unable to urinate, excessively bloody urine, chest pain, shortness of breath, leg swelling, leg pain, or any other concerns or questions.   You can reach us  at [phone].   FOLLOW-UP:  1. You will be contacted for a follow-up appointment in 1 month 2. You have a string attached to your stent, you may remove it on Sunday, 06/29/2024. To do this, pull the string until the stent is completely removed. You may feel an odd sensation in your back.

## 2024-06-26 NOTE — Anesthesia Postprocedure Evaluation (Signed)
 Anesthesia Post Note  Patient: Tregan Crafton  Procedure(s) Performed: CYSTOSCOPY/URETEROSCOPY/HOLMIUM LASER/STENT PLACEMENT (Left: Ureter)  Patient location during evaluation: PACU Anesthesia Type: General Level of consciousness: awake and alert Pain management: pain level controlled Vital Signs Assessment: post-procedure vital signs reviewed and stable Respiratory status: spontaneous breathing, nonlabored ventilation and respiratory function stable Cardiovascular status: blood pressure returned to baseline and stable Postop Assessment: no apparent nausea or vomiting Anesthetic complications: no   No notable events documented.   Last Vitals:  Vitals:   06/26/24 0930 06/26/24 0953  BP:  (!) 122/93  Pulse: 67 65  Resp: 16 16  Temp:  36.5 C  SpO2: 100% 100%    Last Pain:  Vitals:   06/26/24 0953  TempSrc: Temporal  PainSc: 0-No pain                 Camellia Merilee Louder

## 2024-06-27 ENCOUNTER — Encounter: Payer: Self-pay | Admitting: Urology

## 2024-07-08 LAB — STONE ANALYSIS
Calcium Oxalate Dihydrate: 20 %
Calcium Oxalate Monohydrate: 40 %
Calcium Phosphate (Hydroxyl): 40 %
Weight Calculi: 4 mg

## 2024-07-29 ENCOUNTER — Ambulatory Visit: Admitting: Urology

## 2024-07-30 ENCOUNTER — Encounter: Payer: Self-pay | Admitting: Urology

## 2024-08-04 ENCOUNTER — Encounter: Payer: Self-pay | Admitting: Urology

## 2024-08-04 ENCOUNTER — Ambulatory Visit: Admitting: Urology

## 2024-11-19 ENCOUNTER — Encounter: Admitting: General Practice
